# Patient Record
Sex: Male | Born: 1960 | Race: White | Hispanic: No | Marital: Single | Smoking: Former smoker
Health system: Southern US, Community
[De-identification: ages and names within clinical notes are randomized; demographics above are authoritative.]

## PROBLEM LIST (undated history)

## (undated) DIAGNOSIS — N200 Calculus of kidney: Secondary | ICD-10-CM

## (undated) DIAGNOSIS — R480 Dyslexia and alexia: Secondary | ICD-10-CM

## (undated) DIAGNOSIS — G4730 Sleep apnea, unspecified: Secondary | ICD-10-CM

## (undated) DIAGNOSIS — I10 Essential (primary) hypertension: Secondary | ICD-10-CM

## (undated) DIAGNOSIS — M1990 Unspecified osteoarthritis, unspecified site: Secondary | ICD-10-CM

## (undated) DIAGNOSIS — K429 Umbilical hernia without obstruction or gangrene: Secondary | ICD-10-CM

## (undated) DIAGNOSIS — R0602 Shortness of breath: Secondary | ICD-10-CM

## (undated) DIAGNOSIS — Z87442 Personal history of urinary calculi: Secondary | ICD-10-CM

## (undated) DIAGNOSIS — K219 Gastro-esophageal reflux disease without esophagitis: Secondary | ICD-10-CM

## (undated) HISTORY — PX: UMBILICAL HERNIA REPAIR: SHX196

## (undated) HISTORY — PX: TOTAL KNEE ARTHROPLASTY: SHX125

## (undated) HISTORY — PX: GASTRIC BYPASS: SHX52

## (undated) HISTORY — PX: ESOPHAGOGASTRODUODENOSCOPY (EGD) WITH ESOPHAGEAL DILATION: SHX5812

---

## 1998-06-13 ENCOUNTER — Encounter: Admission: RE | Admit: 1998-06-13 | Discharge: 1998-09-11 | Payer: Self-pay | Admitting: Anesthesiology

## 2000-05-06 ENCOUNTER — Encounter: Payer: Self-pay | Admitting: Neurosurgery

## 2000-05-06 ENCOUNTER — Ambulatory Visit (HOSPITAL_COMMUNITY): Admission: RE | Admit: 2000-05-06 | Discharge: 2000-05-06 | Payer: Self-pay | Admitting: Neurosurgery

## 2000-12-11 ENCOUNTER — Ambulatory Visit (HOSPITAL_COMMUNITY): Admission: RE | Admit: 2000-12-11 | Discharge: 2000-12-11 | Payer: Self-pay | Admitting: Neurosurgery

## 2000-12-11 ENCOUNTER — Encounter: Payer: Self-pay | Admitting: Neurosurgery

## 2002-12-12 HISTORY — PX: BACK SURGERY: SHX140

## 2002-12-16 ENCOUNTER — Ambulatory Visit (HOSPITAL_COMMUNITY): Admission: RE | Admit: 2002-12-16 | Discharge: 2002-12-16 | Payer: Self-pay | Admitting: Unknown Physician Specialty

## 2002-12-19 ENCOUNTER — Emergency Department (HOSPITAL_COMMUNITY): Admission: EM | Admit: 2002-12-19 | Discharge: 2002-12-19 | Payer: Self-pay | Admitting: Emergency Medicine

## 2003-07-12 ENCOUNTER — Ambulatory Visit (HOSPITAL_COMMUNITY): Admission: RE | Admit: 2003-07-12 | Discharge: 2003-07-12 | Payer: Self-pay | Admitting: Unknown Physician Specialty

## 2004-10-15 ENCOUNTER — Ambulatory Visit (HOSPITAL_COMMUNITY): Admission: RE | Admit: 2004-10-15 | Discharge: 2004-10-15 | Payer: Self-pay | Admitting: General Surgery

## 2004-10-31 ENCOUNTER — Observation Stay (HOSPITAL_COMMUNITY): Admission: RE | Admit: 2004-10-31 | Discharge: 2004-11-01 | Payer: Self-pay | Admitting: General Surgery

## 2006-01-23 ENCOUNTER — Observation Stay (HOSPITAL_COMMUNITY): Admission: RE | Admit: 2006-01-23 | Discharge: 2006-01-24 | Payer: Self-pay | Admitting: General Surgery

## 2007-08-20 HISTORY — PX: FOOT SURGERY: SHX648

## 2007-10-07 ENCOUNTER — Ambulatory Visit (HOSPITAL_COMMUNITY): Admission: RE | Admit: 2007-10-07 | Discharge: 2007-10-07 | Payer: Self-pay | Admitting: Family Medicine

## 2008-04-20 ENCOUNTER — Emergency Department (HOSPITAL_COMMUNITY): Admission: EM | Admit: 2008-04-20 | Discharge: 2008-04-20 | Payer: Self-pay | Admitting: Emergency Medicine

## 2008-12-11 ENCOUNTER — Ambulatory Visit: Admission: RE | Admit: 2008-12-11 | Discharge: 2008-12-11 | Payer: Self-pay | Admitting: Family Medicine

## 2009-02-13 ENCOUNTER — Ambulatory Visit (HOSPITAL_COMMUNITY): Admission: RE | Admit: 2009-02-13 | Discharge: 2009-02-13 | Payer: Self-pay | Admitting: Family Medicine

## 2009-09-22 ENCOUNTER — Inpatient Hospital Stay (HOSPITAL_COMMUNITY): Admission: RE | Admit: 2009-09-22 | Discharge: 2009-09-25 | Payer: Self-pay | Admitting: Orthopedic Surgery

## 2009-10-18 ENCOUNTER — Encounter: Admission: RE | Admit: 2009-10-18 | Discharge: 2010-01-17 | Payer: Self-pay | Admitting: Orthopedic Surgery

## 2009-12-10 ENCOUNTER — Emergency Department (HOSPITAL_COMMUNITY): Admission: EM | Admit: 2009-12-10 | Discharge: 2009-12-10 | Payer: Self-pay | Admitting: Emergency Medicine

## 2009-12-20 ENCOUNTER — Ambulatory Visit (HOSPITAL_BASED_OUTPATIENT_CLINIC_OR_DEPARTMENT_OTHER): Admission: RE | Admit: 2009-12-20 | Discharge: 2009-12-20 | Payer: Self-pay | Admitting: Orthopedic Surgery

## 2010-01-18 ENCOUNTER — Encounter
Admission: RE | Admit: 2010-01-18 | Discharge: 2010-04-18 | Payer: Self-pay | Source: Home / Self Care | Admitting: Orthopedic Surgery

## 2010-03-05 ENCOUNTER — Inpatient Hospital Stay (HOSPITAL_COMMUNITY): Admission: RE | Admit: 2010-03-05 | Discharge: 2010-03-08 | Payer: Self-pay | Admitting: Orthopedic Surgery

## 2010-03-09 ENCOUNTER — Emergency Department (HOSPITAL_COMMUNITY): Admission: EM | Admit: 2010-03-09 | Discharge: 2010-03-09 | Payer: Self-pay | Admitting: Emergency Medicine

## 2010-04-18 ENCOUNTER — Ambulatory Visit (HOSPITAL_COMMUNITY): Admission: RE | Admit: 2010-04-18 | Discharge: 2010-04-18 | Payer: Self-pay | Admitting: Orthopedic Surgery

## 2010-04-24 ENCOUNTER — Encounter: Admission: RE | Admit: 2010-04-24 | Discharge: 2010-05-17 | Payer: Self-pay | Admitting: Orthopedic Surgery

## 2010-11-02 LAB — URINALYSIS, ROUTINE W REFLEX MICROSCOPIC
Bilirubin Urine: NEGATIVE
Nitrite: NEGATIVE
Protein, ur: NEGATIVE mg/dL
Specific Gravity, Urine: 1.026 (ref 1.005–1.030)
Urobilinogen, UA: 0.2 mg/dL (ref 0.0–1.0)
pH: 5.5 (ref 5.0–8.0)

## 2010-11-02 LAB — URINE MICROSCOPIC-ADD ON

## 2010-11-02 LAB — PROTIME-INR: Prothrombin Time: 13.6 seconds (ref 11.6–15.2)

## 2010-11-02 LAB — CBC
HCT: 37.8 % — ABNORMAL LOW (ref 39.0–52.0)
Hemoglobin: 12.4 g/dL — ABNORMAL LOW (ref 13.0–17.0)
MCHC: 32.9 g/dL (ref 30.0–36.0)
Platelets: 273 10*3/uL (ref 150–400)
WBC: 9.4 10*3/uL (ref 4.0–10.5)

## 2010-11-02 LAB — COMPREHENSIVE METABOLIC PANEL
AST: 16 U/L (ref 0–37)
Calcium: 9.1 mg/dL (ref 8.4–10.5)
GFR calc non Af Amer: >60 mL/min (ref >60)
Potassium: 3.4 mEq/L — ABNORMAL LOW (ref 3.5–5.1)
Total Bilirubin: 0.2 mg/dL — ABNORMAL LOW (ref 0.3–1.2)
Total Protein: 7.3 g/dL (ref 6.0–8.3)

## 2010-11-03 LAB — POCT I-STAT, CHEM 8
BUN: 12 mg/dL (ref 6–23)
Creatinine, Ser: 0.8 mg/dL (ref 0.4–1.5)
Glucose, Bld: 97 mg/dL (ref 70–99)
HCT: 32 % — ABNORMAL LOW (ref 39.0–52.0)
Hemoglobin: 10.9 g/dL — ABNORMAL LOW (ref 13.0–17.0)

## 2010-11-03 LAB — PROTIME-INR
INR: 2.42 — ABNORMAL HIGH (ref 0.00–1.49)
INR: 2.03 — ABNORMAL HIGH (ref 0.00–1.49)
Prothrombin Time: 26.1 seconds — ABNORMAL HIGH (ref 11.6–15.2)
Prothrombin Time: 14 seconds (ref 11.6–15.2)
Prothrombin Time: 17.4 seconds — ABNORMAL HIGH (ref 11.6–15.2)
Prothrombin Time: 22.8 seconds — ABNORMAL HIGH (ref 11.6–15.2)

## 2010-11-03 LAB — BASIC METABOLIC PANEL
CO2: 31 mEq/L (ref 19–32)
Calcium: 8.3 mg/dL — ABNORMAL LOW (ref 8.4–10.5)
Chloride: 101 mEq/L (ref 96–112)
Creatinine, Ser: 0.98 mg/dL (ref 0.4–1.5)
GFR calc Af Amer: >60 mL/min (ref >60)
GFR calc non Af Amer: >60 mL/min (ref >60)
Glucose, Bld: 145 mg/dL — ABNORMAL HIGH (ref 70–99)

## 2010-11-03 LAB — CBC
HCT: 31.3 % — ABNORMAL LOW (ref 39.0–52.0)
MCH: 28 pg (ref 26.0–34.0)
MCHC: 32.9 g/dL (ref 30.0–36.0)
MCV: 83.8 fL (ref 78.0–100.0)
RBC: 3.72 MIL/uL — ABNORMAL LOW (ref 4.22–5.81)
RBC: 3.75 MIL/uL — ABNORMAL LOW (ref 4.22–5.81)
WBC: 13.1 10*3/uL — ABNORMAL HIGH (ref 4.0–10.5)
WBC: 13.9 10*3/uL — ABNORMAL HIGH (ref 4.0–10.5)

## 2010-11-03 LAB — POCT CARDIAC MARKERS
CKMB, poc: 1.5 ng/mL (ref 1.0–8.0)
Myoglobin, poc: 286 ng/mL (ref 12–200)

## 2010-11-04 LAB — URINALYSIS, ROUTINE W REFLEX MICROSCOPIC
Bilirubin Urine: NEGATIVE
Ketones, ur: NEGATIVE mg/dL
Protein, ur: NEGATIVE mg/dL
Urobilinogen, UA: 0.2 mg/dL (ref 0.0–1.0)

## 2010-11-04 LAB — CBC
HCT: 38.3 % — ABNORMAL LOW (ref 39.0–52.0)
Hemoglobin: 12.7 g/dL — ABNORMAL LOW (ref 13.0–17.0)
MCV: 82.2 fL (ref 78.0–100.0)
Platelets: 253 10*3/uL (ref 150–400)
RBC: 4.66 MIL/uL (ref 4.22–5.81)
WBC: 9.3 10*3/uL (ref 4.0–10.5)

## 2010-11-04 LAB — COMPREHENSIVE METABOLIC PANEL
Albumin: 3.4 g/dL — ABNORMAL LOW (ref 3.5–5.2)
Alkaline Phosphatase: 74 U/L (ref 39–117)
BUN: 10 mg/dL (ref 6–23)
Chloride: 105 mEq/L (ref 96–112)
Potassium: 3.6 mEq/L (ref 3.5–5.1)
Total Bilirubin: 0.6 mg/dL (ref 0.3–1.2)

## 2010-11-04 LAB — PROTIME-INR: INR: 1.03 (ref 0.00–1.49)

## 2010-11-04 LAB — SURGICAL PCR SCREEN: Staphylococcus aureus: NEGATIVE

## 2010-11-05 LAB — CBC
HCT: 41 % (ref 39.0–52.0)
Hemoglobin: 13.1 g/dL (ref 13.0–17.0)
MCHC: 32 g/dL (ref 30.0–36.0)
RDW: 14.1 % (ref 11.5–15.5)

## 2010-11-05 LAB — URINALYSIS, ROUTINE W REFLEX MICROSCOPIC
Leukocytes, UA: NEGATIVE
Protein, ur: NEGATIVE mg/dL
Urobilinogen, UA: 0.2 mg/dL (ref 0.0–1.0)

## 2010-11-05 LAB — COMPREHENSIVE METABOLIC PANEL
BUN: 10 mg/dL (ref 6–23)
Calcium: 8.7 mg/dL (ref 8.4–10.5)
Creatinine, Ser: 0.94 mg/dL (ref 0.4–1.5)
Glucose, Bld: 96 mg/dL (ref 70–99)
Sodium: 140 mEq/L (ref 135–145)
Total Protein: 7 g/dL (ref 6.0–8.3)

## 2010-11-05 LAB — URINE MICROSCOPIC-ADD ON

## 2010-11-05 LAB — PROTIME-INR
INR: 1.01 (ref 0.00–1.49)
Prothrombin Time: 13.2 seconds (ref 11.6–15.2)

## 2010-11-05 LAB — APTT: aPTT: 32 seconds (ref 24–37)

## 2010-11-08 LAB — CBC
HCT: 36.8 % — ABNORMAL LOW (ref 39.0–52.0)
Hemoglobin: 11.1 g/dL — ABNORMAL LOW (ref 13.0–17.0)
Hemoglobin: 12.4 g/dL — ABNORMAL LOW (ref 13.0–17.0)
MCHC: 33.2 g/dL (ref 30.0–36.0)
MCHC: 33.5 g/dL (ref 30.0–36.0)
MCV: 85.6 fL (ref 78.0–100.0)
Platelets: 213 10*3/uL (ref 150–400)
Platelets: 243 10*3/uL (ref 150–400)
Platelets: 271 10*3/uL (ref 150–400)
RDW: 14.1 % (ref 11.5–15.5)
RDW: 14.4 % (ref 11.5–15.5)
WBC: 17 10*3/uL — ABNORMAL HIGH (ref 4.0–10.5)

## 2010-11-08 LAB — PROTIME-INR
INR: 1.09 (ref 0.00–1.49)
INR: 1.35 (ref 0.00–1.49)
Prothrombin Time: 16.6 seconds — ABNORMAL HIGH (ref 11.6–15.2)
Prothrombin Time: 20.1 seconds — ABNORMAL HIGH (ref 11.6–15.2)

## 2010-11-08 LAB — ABO/RH: ABO/RH(D): A POS

## 2010-11-08 LAB — URINE MICROSCOPIC-ADD ON

## 2010-11-08 LAB — URINALYSIS, MICROSCOPIC ONLY
Bilirubin Urine: NEGATIVE
Ketones, ur: NEGATIVE mg/dL
Nitrite: NEGATIVE
Protein, ur: NEGATIVE mg/dL
Specific Gravity, Urine: 1.03 (ref 1.005–1.030)

## 2010-11-08 LAB — BASIC METABOLIC PANEL
BUN: 13 mg/dL (ref 6–23)
BUN: 16 mg/dL (ref 6–23)
CO2: 31 mEq/L (ref 19–32)
Calcium: 8.4 mg/dL (ref 8.4–10.5)
Calcium: 8.6 mg/dL (ref 8.4–10.5)
Chloride: 101 mEq/L (ref 96–112)
Creatinine, Ser: 1.04 mg/dL (ref 0.4–1.5)
GFR calc Af Amer: >60 mL/min (ref >60)
GFR calc non Af Amer: >60 mL/min (ref >60)
Glucose, Bld: 135 mg/dL — ABNORMAL HIGH (ref 70–99)
Potassium: 4.1 mEq/L (ref 3.5–5.1)
Sodium: 134 mEq/L — ABNORMAL LOW (ref 135–145)

## 2010-11-08 LAB — TYPE AND SCREEN: Antibody Screen: NEGATIVE

## 2010-11-08 LAB — URINALYSIS, ROUTINE W REFLEX MICROSCOPIC
Glucose, UA: NEGATIVE mg/dL
Ketones, ur: NEGATIVE mg/dL
Leukocytes, UA: NEGATIVE
Nitrite: NEGATIVE
Protein, ur: NEGATIVE mg/dL
Urobilinogen, UA: 0.2 mg/dL (ref 0.0–1.0)

## 2011-01-04 NOTE — Procedures (Signed)
   Tanner Meyers, Tanner Meyers                          ACCOUNT NO.:  [id number]   MEDICAL RECORD NO.:  [id number]                   PATIENT TYPE:  OUT   LOCATION:  RESP                                 FACILITY:  [facility]   PHYSICIAN:  Edward L. Juanetta Gosling, M.D.             DATE OF BIRTH:  11-Dec-1960   DATE OF PROCEDURE:  DATE OF DISCHARGE:                              PULMONARY FUNCTION TEST   IMPRESSION:  1. Spirometry shows no definite airflow obstruction, but the flow volume     move is somewhat rounded, which could indicate something like an extra     thoracic defect like tracheal stenosis, etc.  2. Lung volumes show mild restrictive change.  3. DLCO is moderately reduced.                                               Edward L. Juanetta Gosling, M.D.    ELH/MEDQ  D:  12/16/2002  T:  12/17/2002  Job:  [id number]   cc:   Colon Flattery, MD  [address]  [place]  [place] [address]  Fax: [phone]

## 2011-01-04 NOTE — Op Note (Signed)
NAMEJAKUB, DEBOLD NO.:  [id number]   MEDICAL RECORD NO.:  [id number]          PATIENT TYPE:  AMB   LOCATION:  DAY                           FACILITY:  [facility]   PHYSICIAN:  Barbaraann Barthel, M.D. DATE OF BIRTH:  March 07, 1961   DATE OF PROCEDURE:  10/31/2004  DATE OF DISCHARGE:                                 OPERATIVE REPORT   SURGEON:  Dr. Malvin Johns.   PREOPERATIVE DIAGNOSES:  1.  Cholecystitis secondary biliary dyskinesia.  2.  Umbilical hernia.   POSTOPERATIVE DIAGNOSES:  1.  Cholecystitis secondary biliary dyskinesia.  2.  Umbilical hernia.   PROCEDURE:  1.  Laparoscopic cholecystectomy.  2.  Umbilical herniorrhaphy.   SPECIMEN:  Gallbladder.   NOTE:  This is a 50 year old white male who had several weeks of right upper  quadrant pain and nausea and vomiting that was postprandial in nature. He  also presented with an umbilical hernia that he had had for approximately 20  years that had increased in size. We had discussed repair with this at the  same procedure in order to save him from being off from work and discussed  the complications of the procedure not limited to but including bleeding,  infection and regarding his gallbladder perforation of organs, damage to  bile ducts, and transitory diarrhea and regarding his umbilical hernia the  possibility of recurrence. Informed consent was obtained.   We should state that workup also included hepatobiliary scan which showed a  12% ejection fraction strongly suggestive of biliary dyskinesia. We also  discussed preoperatively that biliary dyskinesia is an entity whose  operative results are not as satisfying as in the case for gallstones.  Informed consent was obtained.   GROSS OPERATIVE FINDINGS:  The patient had a very fatty infiltrated  gallbladder, a small cystic duct which was not cannulated. No stones within  the gallbladder, and an umbilical hernia that had incarcerated omentum  within it.   TECHNIQUE:  The patient was placed in the supine position. After the inner  adequate administration of general anesthesia via endotracheal intubation,  his entire abdomen was prepped with Betadine solution and draped in the  usual manner. Prior to this, a Foley catheter was aseptically inserted. A  periumbilical incision was carried out just above the umbilical hernia, and  a Veress needle was inserted and confirmed in position with a saline drop  test. The abdomen was then insufflated approximately 3.6 liters of CO2. An  11-mm cannula was then placed in the umbilicus incision, and then under  direct vision, three other cannulas were placed. An 11-mm cannula just to  the left of the midline. There was a huge peripheral vein right in the area  where we usually placed the epigastric cannula and therefore we wanted to  avoid this. We then placed an 11-mm cannula in this site and then two 5-mm  cannulas in the right upper quadrant laterally. We then grasped the  gallbladder, took down its fatty adhesions, isolated the cystic duct which  was which was triply silver clipped. The patient had a lot of fatty omentum  which I likewise clipped that was adherent, and this was removed in order to  avoid any undue bleeding. The cystic artery was likewise cannulated and  triply silver clipped and divided. The gallbladder was  then removed  uneventfully from the hepatic bed with a cautery device and then removed  with an EndoCatch device through the epigastric incision. I elected to leave  Jackson-Pratt drain and Surgicel in the liver bed. Attention was then turned  to the umbilicus. This was dissected free, and then we did an open closure  using #1 Prolene to close the fascia in a transverse fashion after  dissecting the umbilicus skin from the defect, hernia sac and returning the  omentum that was dissected free back into the abdominal cavity. The fascia  was closed with #1 Prolene as state, interrupted  figure-of-eight fashion,  and we then used  #0 Prolene to close the incision where the 11-mm cannula  was placed in the umbilicus. We then irrigated and then closed the  subcutaneous layer, suturing the umbilicus to the fascia with 3-0 Polysorb  in order to return the umbilicus to its natural concave anatomy. We then  used 1/2% Sensorcaine on all incision sites to help with postoperative  comfort. We sutured the drain that exited from the lateral cannula site with  to the skin with 3-0 nylon suture and then put sterile OpSite dressings  around the epigastric incision and the medial 5-mm cannula sites and 4x4s  around the drain site and the umbilicus repair. Prior to closure, all  sponge, needle and instrument counts were found to be correct. Estimated  blood loss was minimal. The patient received 1,800 cc of crystalloids  intraoperatively. There were no complications.      WB/MEDQ  D:  10/31/2004  T:  10/31/2004  Job:  [id number]

## 2011-01-04 NOTE — Procedures (Signed)
NAMEHILBERT, Tanner Meyers                ACCOUNT NO.:  [id number]   MEDICAL RECORD NO.:  [id number]          PATIENT TYPE:  OUT   LOCATION:  SLEE                          FACILITY:  [facility]   PHYSICIAN:  Kofi A. Gerilyn Pilgrim, M.D. DATE OF BIRTH:  [birth year]   DATE OF PROCEDURE:  DATE OF DISCHARGE:  12/11/2008                             SLEEP DISORDER REPORT   REFERRING PHYSICIAN:  Delaney Meigs, MD   INDICATIONS:  This is a 51 year old man who presents with hypersomnia  snoring and is being evaluated for obstructive sleep apnea syndrome.   MEDICATIONS:  None.   EPWORTH SLEEPINESS SCALE:  Epworth sleepiness scale 16.  BMI 49.   ARCHITECTURAL SUMMARY:  This is a split night recording with the first  part being a diagnostic and the second part, a titration recording.  The  total recording time is 419 minutes.  Sleep efficiency is 68%.  Sleep  latency is 21 minutes.  The REM latency is 128 minutes.   RESPIRATORY SUMMARY:  Baseline oxygen saturation is 97%.  Lowest  saturation is 71% during the REM sleep.  The diagnostic AHI is 52 with  more frequent and more severe events occurring during the REM sleep.  The patient was titrated between pressures of 5 and 11.  The optimal  pressure is 11 with resolution of obstructive events.   LIMB MOVEMENT SUMMARY:  PLM index is 21.   ELECTROCARDIOGRAM SUMMARY:  Average heart rate is 62 with no significant  dysrhythmias observed.   IMPRESSION:  Severe obstructive sleep apnea syndrome worse during the  rapid eye movement sleep, which responds well to a continuous positive  airway pressure of 11.   Thanks for this referral.      Kofi A. Gerilyn Pilgrim, M.D.  Electronically Signed     KAD/MEDQ  D:  12/16/2008  T:  12/16/2008  Job:  [id number]

## 2011-01-04 NOTE — Op Note (Signed)
[place]  Patient:    Tanner Meyers, Tanner Meyers                       MRN: [id number] Proc. Date: 12/11/00 Adm. 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  OPERATION PERFORMED:  Right L4-5 interlaminar laminotomy for excision of herniated disk with operating microscope.  SURGEON:  Reinaldo Meeker, M.D.  ASSISTANT:  Donalee Citrin, Montez Hageman.  ANESTHESIA:  DESCRIPTION OF PROCEDURE:  After being placed in the prone position, the patients back was prepped and draped in the usual sterile fashion. Localizing x-ray was taken prior to incision to identify the appropriate level.  A midline incision was made about the spinous processes of L5 and L5. Using Bovie cutting current, the incision was carried down to the spinous processes.  Subperiosteal dissection was then carried out on the right-sided spinous processes and lamina and McCullough self-retaining retractor was placed for exposure.  A second x-ray was taken to confirm approach to the L4-5 level and this was correct.  Using the high speed drill, the inferior one third of the L4 lamina and the medial one third of the facet joint were removed.  The drill was then used to remove the superior one third of the L5 lamina.  Residual bone and ligamentum flavum were removed in a piecemeal fashion.  The microscope was draped and brought into the field and used for the remainder of the case.  Using microdissection technique, the lateral aspect of the thecal sac and L5 nerve root were identified.  Bovie coagulation was carried down to the floor of the canal to identify the L4-5 disk was found to be markedly herniated beneath the nerve root and centrally.  After coagulating the annulus, the annulus was incised with a 15 blade.   Using pituitary rongeurs and curets, a very thorough disk space clean-out was carried out while at the same time  great care was taken to avoid injury to the neural elements.  This was successfully done.  Calcified annulus at the superior edge of the disk space was then pushed down in the disk space and removed, allowing additional disk space clean out.  At this point inspection was carried out in all directions for any evidence of residual compression and none could be identified.  Particular attention was paid to the midline where no further disk herniation could be felt.  At this point large amounts of irrigation were carried out and any bleeding controlled with bipolar coagulation and Gelfoam.  The wound was then closed using interrupted Vicryl in the muscle, fascia, subcutaneous tissues, subcuticular tissues and staples on the skin.  Sterile dressing was then applied.  Patient was extubated and taken to the recovery room in stable condition. DD:  12/11/00 TD:  12/11/00 Job: [id number]

## 2011-01-04 NOTE — Op Note (Signed)
NAMEDINNIS, ROG NO.:  [id number]   MEDICAL RECORD NO.:  [id number]          PATIENT TYPE:  AMB   LOCATION:  DAY                           FACILITY:  [facility]   PHYSICIAN:  Barbaraann Barthel, M.D. DATE OF BIRTH:  21-Sep-1960   DATE OF PROCEDURE:  01/23/2006  DATE OF DISCHARGE:                                 OPERATIVE REPORT   SURGEON:  Dr. Malvin Johns.   PREOPERATIVE DIAGNOSIS:  Recurrent umbilical hernia.   POSTOPERATIVE DIAGNOSIS:  Recurrent umbilical hernia.   PROCEDURE:  Repair of her recurrent umbilical hernia with MTF allograft  graft.   NOTE:  This is a 50 year old obese white male who I repaired an umbilical  hernia on earlier, and during a laparoscopic cholecystectomy procedure, this  recurred.  This person was quite large,  weighing 295 pounds and he  presented with recurrence.  We discussed the need for placing a mesh in this  patient, discussing complications not limited to but including bleeding,  infection and recurrence.  Informed consent was obtained.   GROSS OPERATIVE FINDINGS:  The patient had a hernia defect slightly larger  than a silver dollar.  No infarcted bowel or any infarcted omentum or other  problems.   TECHNIQUE:  The patient was placed in supine position.  After adequate  administration of general anesthesia via endotracheal intubation, his entire  abdomen was prepped with Betadine solution and draped in the usual manner.  A supraumbilical incision was carried out over the area of the previous scar  through skin, subcutaneous tissue.  We then dissected down to the fascia,  and then having good circumferential fascia around this, we then placed a  MTF graft inside the abdomen.  This graft had been soaked in saline and  Neosporin solution.  We then tacked this circumferentially with 2-0 Prolene  sutures without any great tension.  This was sutured circumferentially.  We  then tacked the umbilicus to a piece of fascia tissue in order  to return the  umbilicus to its normal concave appearance.  We then closed the subcutaneous  tissue with 3-0 Polysorb and closed the skin with a stapling device.  A  sterile dressing with Neosporin was applied.  Prior to closure, all sponge,  needle and instrument counts were found be correct.  Estimated blood loss  was minimal.  The patient received 2100 mL crystalloids intraoperatively.  No drains were placed.  There were no complications.      Barbaraann Barthel, M.D.  Electronically Signed     WB/MEDQ  D:  01/23/2006  T:  01/23/2006  Job:  [id number]

## 2011-02-04 ENCOUNTER — Other Ambulatory Visit (HOSPITAL_COMMUNITY): Payer: Self-pay

## 2011-02-04 ENCOUNTER — Other Ambulatory Visit: Payer: Self-pay | Admitting: Orthopedic Surgery

## 2011-02-04 ENCOUNTER — Other Ambulatory Visit (HOSPITAL_COMMUNITY): Payer: Self-pay | Admitting: Orthopedic Surgery

## 2011-02-04 ENCOUNTER — Ambulatory Visit (HOSPITAL_COMMUNITY)
Admission: RE | Admit: 2011-02-04 | Discharge: 2011-02-04 | Disposition: A | Discharge status: Home / Self Care | Payer: Self-pay | Source: Ambulatory Visit | Attending: Orthopedic Surgery | Admitting: Orthopedic Surgery

## 2011-02-04 ENCOUNTER — Encounter (HOSPITAL_COMMUNITY): Payer: Self-pay

## 2011-02-04 DIAGNOSIS — M129 Arthropathy, unspecified: Secondary | ICD-10-CM

## 2011-02-04 DIAGNOSIS — M479 Spondylosis, unspecified: Secondary | ICD-10-CM | POA: Insufficient documentation

## 2011-02-04 DIAGNOSIS — Z01810 Encounter for preprocedural cardiovascular examination: Secondary | ICD-10-CM | POA: Insufficient documentation

## 2011-02-04 DIAGNOSIS — M24669 Ankylosis, unspecified knee: Secondary | ICD-10-CM | POA: Insufficient documentation

## 2011-02-04 DIAGNOSIS — G4730 Sleep apnea, unspecified: Secondary | ICD-10-CM | POA: Insufficient documentation

## 2011-02-04 DIAGNOSIS — M2469 Ankylosis, other specified joint: Secondary | ICD-10-CM | POA: Insufficient documentation

## 2011-02-04 DIAGNOSIS — Z01812 Encounter for preprocedural laboratory examination: Secondary | ICD-10-CM | POA: Insufficient documentation

## 2011-02-04 DIAGNOSIS — Z01818 Encounter for other preprocedural examination: Secondary | ICD-10-CM | POA: Insufficient documentation

## 2011-02-04 LAB — URINALYSIS, ROUTINE W REFLEX MICROSCOPIC
Glucose, UA: NEGATIVE mg/dL
Leukocytes, UA: NEGATIVE
Protein, ur: NEGATIVE mg/dL
pH: 6 (ref 5.0–8.0)

## 2011-02-04 LAB — PROTIME-INR
INR: 1 (ref 0.00–1.49)
Prothrombin Time: 13.4 seconds (ref 11.6–15.2)

## 2011-02-04 LAB — COMPREHENSIVE METABOLIC PANEL
Albumin: 3.6 g/dL (ref 3.5–5.2)
BUN: 16 mg/dL (ref 6–23)
Calcium: 9.5 mg/dL (ref 8.4–10.5)
Creatinine, Ser: 0.87 mg/dL (ref 0.50–1.35)
Total Bilirubin: 0.2 mg/dL — ABNORMAL LOW (ref 0.3–1.2)
Total Protein: 7.2 g/dL (ref 6.0–8.3)

## 2011-02-04 LAB — URINE MICROSCOPIC-ADD ON

## 2011-02-04 LAB — CBC
MCHC: 31 g/dL (ref 30.0–36.0)
RDW: 14.4 % (ref 11.5–15.5)

## 2011-02-04 LAB — SURGICAL PCR SCREEN: Staphylococcus aureus: NEGATIVE

## 2011-02-11 ENCOUNTER — Ambulatory Visit (HOSPITAL_COMMUNITY)
Admission: RE | Admit: 2011-02-11 | Discharge: 2011-02-11 | Disposition: A | Discharge status: Home / Self Care | Payer: Self-pay | Source: Ambulatory Visit | Attending: Orthopedic Surgery | Admitting: Orthopedic Surgery

## 2011-02-11 DIAGNOSIS — Z96659 Presence of unspecified artificial knee joint: Secondary | ICD-10-CM | POA: Insufficient documentation

## 2011-02-11 DIAGNOSIS — I498 Other specified cardiac arrhythmias: Secondary | ICD-10-CM | POA: Insufficient documentation

## 2011-02-11 DIAGNOSIS — M24669 Ankylosis, unspecified knee: Secondary | ICD-10-CM | POA: Insufficient documentation

## 2011-02-11 DIAGNOSIS — Z01818 Encounter for other preprocedural examination: Secondary | ICD-10-CM | POA: Insufficient documentation

## 2011-02-11 DIAGNOSIS — M2469 Ankylosis, other specified joint: Secondary | ICD-10-CM | POA: Insufficient documentation

## 2011-02-11 DIAGNOSIS — G4733 Obstructive sleep apnea (adult) (pediatric): Secondary | ICD-10-CM | POA: Insufficient documentation

## 2011-02-11 DIAGNOSIS — Z01812 Encounter for preprocedural laboratory examination: Secondary | ICD-10-CM | POA: Insufficient documentation

## 2011-02-11 DIAGNOSIS — Z01810 Encounter for preprocedural cardiovascular examination: Secondary | ICD-10-CM | POA: Insufficient documentation

## 2011-02-13 NOTE — Op Note (Signed)
  NAMEHILMAN, Tanner Meyers NO.:  [id number]  MEDICAL RECORD NO.:  [id number]  LOCATION:  DAYL                         FACILITY:  [facility]  PHYSICIAN:  Ollen Gross, M.D.    DATE OF BIRTH:  August 04, 1961  DATE OF PROCEDURE:  02/11/2011 DATE OF DISCHARGE:  02/11/2011                              OPERATIVE REPORT   PREOPERATIVE DIAGNOSIS:  Arthrofibrosis, left knee.  POSTOPERATIVE DIAGNOSIS:  Arthrofibrosis, left knee.  PROCEDURE:  Left knee closed manipulation.  SURGEON:  Ollen Gross, MD  ASSISTANT:  None.  ANESTHESIA:  General.  Pre-manipulation range of motion 5 to 90, post-manipulation range of motion 5 to 120.  COMPLICATIONS:  None.  CONDITION:  Stable to Recovery.  BRIEF CLINICAL NOTE:  Tanner Meyers is a 50 year old male who had a left total knee done approximately a year ago.  He has had problems with stiffness. He was seen in the office 2 weeks ago and flexion was only 90 degrees. We discussed the options and he opted for closed manipulation.  He presents today for that procedure.  PROCEDURE IN DETAIL:  After successful administration of general anesthetic, an exam under anesthesia was performed showing range 5 to 90 degrees.  I then placed my chest against his proximal tibia, flexed the knee with audible lysis of adhesions.  I got to 120 degrees easily at the point where his calf was hitting the posterior thigh.  He got within 3-4 degrees of full extension also.  We manipulated patella to help mobilize that better.  He was subsequently awakened and transported to Recovery in stable condition.     Ollen Gross, M.D.     FA/MEDQ  D:  02/11/2011  T:  02/11/2011  Job:  [id number]  Electronically Signed by Ollen Gross M.D. on 02/13/2011 10:09:01 AM

## 2011-05-22 LAB — URINE MICROSCOPIC-ADD ON

## 2011-05-22 LAB — URINALYSIS, ROUTINE W REFLEX MICROSCOPIC
Bilirubin Urine: NEGATIVE
Leukocytes, UA: NEGATIVE
Nitrite: NEGATIVE
Specific Gravity, Urine: >1.03 — ABNORMAL HIGH
pH: 5

## 2013-02-08 ENCOUNTER — Telehealth: Payer: Self-pay

## 2013-02-08 ENCOUNTER — Other Ambulatory Visit: Payer: Self-pay

## 2013-02-08 DIAGNOSIS — Z1211 Encounter for screening for malignant neoplasm of colon: Secondary | ICD-10-CM

## 2013-02-10 NOTE — Telephone Encounter (Signed)
Gastroenterology Pre-Procedure Form    Request Date: 02/09/2013     Requesting Physician: Dr. Regino Schultze     PATIENT INFORMATION:  Tanner Meyers is a 53 y.o., male (DOB=May 27, 1961).  PROCEDURE: Procedure(s) requested: colonoscopy Procedure Reason: screening for colon cancer  PATIENT REVIEW QUESTIONS: The patient reports the following:   1. Diabetes Melitis: no 2. Joint replacements in the past 12 months: no 3. Major health problems in the past 3 months: no 4. Has an artificial valve or MVP:no 5. Has been advised in past to take antibiotics in advance of a procedure like teeth cleaning: no}    MEDICATIONS & ALLERGIES:    Patient reports the following regarding taking any blood thinners:   Plavix? no Aspirin?no Coumadin?  no  Patient confirms/reports the following medications:  Current Outpatient Prescriptions  Medication Sig Dispense Refill  . doxycycline (VIBRAMYCIN) 100 MG capsule Take 100 mg by mouth 2 (two) times daily. Pt is finishing a 10 day supply for blisters under his arm      . losartan (COZAAR) 50 MG tablet Take 50 mg by mouth daily.       No current facility-administered medications for this visit.    Patient confirms/reports the following allergies:  No Known Allergies  Patient is appropriate to schedule for requested procedure(s): yes  AUTHORIZATION INFORMATION Primary Insurance:   ID #:  Group #:  Pre-Cert / Auth required:  Pre-Cert / Auth #:   Secondary Insurance:   ID #:   Group #:  Pre-Cert / Auth required Pre-Cert / Auth #:   No orders of the defined types were placed in this encounter.    SCHEDULE INFORMATION: Procedure has been scheduled as follows:  Date: 03/01/2013     Time: 10:30 AM  Location: [facility] Short Stay  This Gastroenterology Pre-Precedure Form is being routed to the following provider(s) for review: R. Roetta Sessions, MD

## 2013-02-11 NOTE — Telephone Encounter (Signed)
Appropriate.

## 2013-02-12 MED ORDER — PEG-KCL-NACL-NASULF-NA ASC-C 100 G PO SOLR: 1.0000 | ORAL | Status: DC

## 2013-02-12 NOTE — Telephone Encounter (Signed)
Rx sent to pharmacy and instructions mailed to pt.  

## 2013-02-15 ENCOUNTER — Telehealth: Payer: Self-pay

## 2013-02-15 NOTE — Telephone Encounter (Signed)
Pt called and said he has a hernia at his umbilicus. He has had surgery before and has a mesh, but it came back. He wants to know is it save to have the colonoscopy and have the air inserted. He is limited to his lifting ability. Please advise!  ( Call back is [phone]).

## 2013-02-16 NOTE — Telephone Encounter (Signed)
Called and informed pt.  

## 2013-02-16 NOTE — Telephone Encounter (Signed)
Called pt. He said that he is having some bulging at his belly button. It has been several years ( he was not sure when) that Dr. Malvin Johns did surgery on him. He has been going to the [facility] at [place] and he had one doctor tell him that it might be a couple of hernias in that area.

## 2013-02-16 NOTE — Telephone Encounter (Signed)
It should be ok. We do procedures on patients with known hernias routinely.

## 2013-02-16 NOTE — Telephone Encounter (Signed)
How large is this hernia? Shouldn't be an issue.

## 2013-02-25 ENCOUNTER — Encounter (HOSPITAL_COMMUNITY): Payer: Self-pay | Admitting: Pharmacy Technician

## 2013-03-01 ENCOUNTER — Encounter (HOSPITAL_COMMUNITY)
Admission: RE | Disposition: A | Discharge status: Home / Self Care | Payer: Self-pay | Source: Ambulatory Visit | Attending: Internal Medicine

## 2013-03-01 ENCOUNTER — Encounter (HOSPITAL_COMMUNITY): Payer: Self-pay | Admitting: *Deleted

## 2013-03-01 ENCOUNTER — Ambulatory Visit (HOSPITAL_COMMUNITY)
Admission: RE | Admit: 2013-03-01 | Discharge: 2013-03-01 | Disposition: A | Discharge status: Home / Self Care | Payer: BC Managed Care – PPO | Source: Ambulatory Visit | Attending: Internal Medicine | Admitting: Internal Medicine

## 2013-03-01 DIAGNOSIS — D126 Benign neoplasm of colon, unspecified: Secondary | ICD-10-CM

## 2013-03-01 DIAGNOSIS — Z1211 Encounter for screening for malignant neoplasm of colon: Secondary | ICD-10-CM

## 2013-03-01 DIAGNOSIS — I10 Essential (primary) hypertension: Secondary | ICD-10-CM | POA: Insufficient documentation

## 2013-03-01 DIAGNOSIS — D129 Benign neoplasm of anus and anal canal: Secondary | ICD-10-CM | POA: Insufficient documentation

## 2013-03-01 DIAGNOSIS — K5730 Diverticulosis of large intestine without perforation or abscess without bleeding: Secondary | ICD-10-CM | POA: Insufficient documentation

## 2013-03-01 DIAGNOSIS — K620 Anal polyp: Secondary | ICD-10-CM

## 2013-03-01 DIAGNOSIS — K621 Rectal polyp: Secondary | ICD-10-CM

## 2013-03-01 DIAGNOSIS — D128 Benign neoplasm of rectum: Secondary | ICD-10-CM | POA: Insufficient documentation

## 2013-03-01 HISTORY — DX: Umbilical hernia without obstruction or gangrene: K42.9

## 2013-03-01 HISTORY — DX: Personal history of urinary calculi: Z87.442

## 2013-03-01 HISTORY — DX: Sleep apnea, unspecified: G47.30

## 2013-03-01 HISTORY — DX: Gastro-esophageal reflux disease without esophagitis: K21.9

## 2013-03-01 HISTORY — DX: Unspecified osteoarthritis, unspecified site: M19.90

## 2013-03-01 HISTORY — PX: COLONOSCOPY: SHX5424

## 2013-03-01 HISTORY — DX: Essential (primary) hypertension: I10

## 2013-03-01 HISTORY — DX: Shortness of breath: R06.02

## 2013-03-01 SURGERY — COLONOSCOPY
Anesthesia: Moderate Sedation

## 2013-03-01 MED ORDER — STERILE WATER FOR IRRIGATION IR SOLN
Status: DC | PRN
  Administered 2013-03-01: 11:00:00

## 2013-03-01 MED ORDER — MIDAZOLAM HCL 5 MG/5ML IJ SOLN
INTRAMUSCULAR | Status: DC | PRN
  Administered 2013-03-01 (×2): 2 mg via INTRAVENOUS
  Administered 2013-03-01: 1 mg via INTRAVENOUS

## 2013-03-01 MED ORDER — ONDANSETRON HCL 4 MG/2ML IJ SOLN
INTRAMUSCULAR | Status: DC | PRN
  Administered 2013-03-01: 4 mg via INTRAVENOUS

## 2013-03-01 MED ORDER — SODIUM CHLORIDE 0.9 % IV SOLN
INTRAVENOUS | Status: DC
  Administered 2013-03-01: 11:00:00 via INTRAVENOUS

## 2013-03-01 MED ORDER — ONDANSETRON HCL 4 MG/2ML IJ SOLN
INTRAMUSCULAR | Status: AC
  Filled 2013-03-01: qty 2

## 2013-03-01 MED ORDER — MIDAZOLAM HCL 5 MG/5ML IJ SOLN
INTRAMUSCULAR | Status: AC
  Filled 2013-03-01: qty 10

## 2013-03-01 MED ORDER — MEPERIDINE HCL 100 MG/ML IJ SOLN
INTRAMUSCULAR | Status: DC | PRN
  Administered 2013-03-01: 50 mg via INTRAVENOUS
  Administered 2013-03-01: 25 mg via INTRAVENOUS

## 2013-03-01 MED ORDER — MEPERIDINE HCL 100 MG/ML IJ SOLN
INTRAMUSCULAR | Status: AC
  Filled 2013-03-01: qty 2

## 2013-03-01 NOTE — Op Note (Signed)
[facility] [address] [place], [address]   COLONOSCOPY PROCEDURE REPORT  PATIENT: [name], [name]  MR#:         [id number] BIRTHDATE: Oct 28, 1960 , 52  yrs. old GENDER: Male ENDOSCOPIST: R.  Roetta Sessions, MD FACP FACG REFERRED BY:  Karleen Hampshire, M.D. PROCEDURE DATE:  03/01/2013 PROCEDURE:     Colonoscopy with biopsy and snare polypectomy  INDICATIONS: First ever average risk colorectal cancer screening examination.  INFORMED CONSENT:  The risks, benefits, alternatives and imponderables including but not limited to bleeding, perforation as well as the possibility of a missed lesion have been reviewed.  The potential for biopsy, lesion removal, etc. have also been discussed.  Questions have been answered.  All parties agreeable. Please see the history and physical in the medical record for more information.  MEDICATIONS: Versed 5 mg IV and Demerol 75 mg IV in divided doses. Zofran 4 mg IV  DESCRIPTION OF PROCEDURE:  After a digital rectal exam was performed, the EC-3890Li ([id number])  colonoscope was advanced from the anus through the rectum and colon to the area of the cecum, ileocecal valve and appendiceal orifice.  The cecum was deeply intubated.  These structures were well-seen and photographed for the record.  From the level of the cecum and ileocecal valve, the scope was slowly and cautiously withdrawn.  The mucosal surfaces were carefully surveyed utilizing scope tip deflection to facilitate fold flattening as needed.  The scope was pulled down into the rectum where a thorough examination including retroflexion was performed.    FINDINGS:  Adequate preparation. (1) diminutive polyp within 2 cm of the anal verge; otherwise, the remainder of the rectal mucosa appeared normal. Scattered sigmoid diverticula; (1) 5 mm sessile polyp in the cecum; otherwise, the remainder of the colonic mucosa appeared normal.  THERAPEUTIC / DIAGNOSTIC MANEUVERS  PERFORMED:  The above-mentioned polyp cold biopsied and hot snare removed, respectively.  COMPLICATIONS: None  CECAL WITHDRAWAL TIME:  9 minutes  IMPRESSION:  Colonic and rectal polyps-removed as described above. Colonic diverticulosis  RECOMMENDATIONS: Followup on pathology.   _______________________________ eSigned:  R. Roetta Sessions, MD FACP [facility] 03/01/2013 11:53 AM   CC:    PATIENT NAME:  Tanner Meyers, Tanner Meyers MR#: [id number]

## 2013-03-01 NOTE — H&P (Signed)
Primary Care Physician:  Pershing Proud Primary Gastroenterologist:  Dr. Jena Gauss  Pre-Procedure History & Physical: HPI:  Tanner Meyers is a 52 y.o. male is here for a screening colonoscopy.     Past Medical History  Diagnosis Date  . Hypertension   . Umbilical hernia   . Shortness of breath     SOB with no exertion  . History of kidney stones   . GERD (gastroesophageal reflux disease)   . Arthritis   . Sleep apnea     supposed to wear BiPaP machine at night, but doesn't    Past Surgical History  Procedure Laterality Date  . 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 Back surgery  12/12/2002    [facility]  . Esophagogastroduodenoscopy (egd) with esophageal dilation      [facility]  . Foot surgery Right 2009    [facility]  . Umbilical hernia repair      [facility]    Prior to Admission medications   Medication Sig Start Date End Date Taking? Authorizing Provider  losartan (COZAAR) 50 MG tablet Take 50 mg by mouth daily.   Yes Historical Provider, MD  peg 3350 powder (MOVIPREP) 100 G SOLR Take 1 kit (100 g total) by mouth as directed. 02/12/13  Yes Corbin Ade, MD    Allergies as of 02/08/2013  . (Not on File)    History reviewed. No pertinent family history.  History   Social History  . Marital Status: Single    Spouse Name: N/A    Number of Children: N/A  . Years of Education: N/A   Occupational History  . Not on file.   Social History Main Topics  . Smoking status: Former Smoker -- 17 years    Types: Cigarettes  . Smokeless tobacco: Not on file  . Alcohol Use: No  . Drug Use: No  . Sexually Active: Not on file   Other Topics Concern  . Not on file   Social History Narrative  . No narrative on file    Review of Systems: See HPI, otherwise negative ROS  Physical Exam: BP 134/86  Temp(Src) 98.2 F (36.8 C) (Oral)  Resp 17  Ht 5\' 8"  (1.727 m)  Wt 321 lb (145.605 kg)   BMI 48.82 kg/m2  SpO2 92% General:   Alert,  Well-developed, well-nourished, morbidly obese, pleasant and cooperative in NAD Head:  Normocephalic and atraumatic. Eyes:  Sclera clear, no icterus.   Conjunctiva pink. Ears:  Normal auditory acuity. Nose:  No deformity, discharge,  or lesions. Mouth:  No deformity or lesions, dentition normal. Neck:  Supple; no masses or thyromegaly. Lungs:  Clear throughout to auscultation.   No wheezes, crackles, or rhonchi. No acute distress. Heart:  Regular rate and rhythm; no murmurs, clicks, rubs,  or gallops. Abdomen:  Morbidly obese. Normal bowel sounds, soft and nontender. No mass or organomegaly. Easily reducible small umbilical hernia.    Msk:  Symmetrical without gross deformities. Normal posture. Pulses:  Normal pulses noted. Extremities:  Without clubbing or edema. Neurologic:  Alert and  oriented x4;  grossly normal neurologically. Skin:  Intact without significant lesions or rashes. Cervical Nodes:  No significant cervical adenopathy. Psych:  Alert and cooperative. Normal mood and affect.  Impression/Plan: Tanner Meyers is now here to undergo a screening colonoscopy.  Average risk screening examination.  Risks, benefits, limitations, imponderables and alternatives regarding colonoscopy have been reviewed with  the patient. Questions have been answered. All parties agreeable.

## 2013-03-03 ENCOUNTER — Encounter: Payer: Self-pay | Admitting: Internal Medicine

## 2013-03-03 ENCOUNTER — Encounter (HOSPITAL_COMMUNITY): Payer: Self-pay | Admitting: Internal Medicine

## 2013-04-27 ENCOUNTER — Other Ambulatory Visit (HOSPITAL_COMMUNITY): Payer: Self-pay | Admitting: Family Medicine

## 2013-04-27 ENCOUNTER — Ambulatory Visit (HOSPITAL_COMMUNITY)
Admission: RE | Admit: 2013-04-27 | Discharge: 2013-04-27 | Disposition: A | Discharge status: Home / Self Care | Payer: BC Managed Care – PPO | Source: Ambulatory Visit | Attending: Family Medicine | Admitting: Family Medicine

## 2013-04-27 DIAGNOSIS — M25559 Pain in unspecified hip: Secondary | ICD-10-CM | POA: Insufficient documentation

## 2013-04-27 DIAGNOSIS — M25551 Pain in right hip: Secondary | ICD-10-CM

## 2013-04-30 ENCOUNTER — Telehealth (HOSPITAL_COMMUNITY): Payer: Self-pay | Admitting: Dietician

## 2013-04-30 NOTE — Telephone Encounter (Signed)
Received referral via fax from [facility] for dx: obesity.  

## 2013-04-30 NOTE — Telephone Encounter (Signed)
Called at 1435. Appointment scheduled for 05/06/13 at 0900.

## 2013-05-06 ENCOUNTER — Encounter (HOSPITAL_COMMUNITY): Payer: Self-pay | Admitting: Dietician

## 2013-05-06 NOTE — Progress Notes (Signed)
Outpatient Initial Nutrition Assessment  Date:05/06/2013   Appt Start Time: 0850  Referring Physician: [facility] Terie Purser, PA-C) Reason for Visit: obesity  Nutrition Assessment:  Height: 5\' 8"  (172.7 cm)   Weight: 324 lb (146.965 kg)   IBW: 154# %IBW: 210% UBW: 344# %UBW: 90% Body mass index is 49.28 kg/(m^2).  Meets criteria for extreme obesity, class III. Goal Weight: 292# (10% loss of current weight) Weight hx: Pt reports highest weight of 344# 4-5 years ago. His lowest weight was 316# about 6 months ago. He reports He maintained UBW of 210# until he was 52 years old, then progressive started gaining weight. JNoted 8# (2.5%) gain x 6 months.  Estimated nutritional needs:  Kcals/ day: 2300-2400 Protein (grams)/day: 118-147 Fluid (L)/ day: 2.3-2.4  PMH:  Past Medical History  Diagnosis Date  . Hypertension   . Umbilical hernia   . Shortness of breath     SOB with no exertion  . History of kidney stones   . GERD (gastroesophageal reflux disease)   . Arthritis   . Sleep apnea     supposed to wear BiPaP machine at night, but doesn't    Medications:  Current Outpatient Rx  Name  Route  Sig  Dispense  Refill  . albuterol (PROVENTIL HFA;VENTOLIN HFA) 108 (90 BASE) MCG/ACT inhaler   Inhalation   Inhale 2 puffs into the lungs every 6 (six) hours as needed for wheezing.         Marland Kitchen losartan (COZAAR) 50 MG tablet   Oral   Take 50 mg by mouth daily.         . traZODone (DESYREL) 50 MG tablet   Oral   Take 50 mg by mouth at bedtime.         Marland Kitchen zolpidem (AMBIEN) 10 MG tablet   Oral   Take 10 mg by mouth at bedtime as needed for sleep.         . peg 3350 powder (MOVIPREP) 100 G SOLR   Oral   Take 1 kit (100 g total) by mouth as directed.   1 kit   0     Labs: CMP     Component Value Date/Time   NA 139 02/04/2011 1310   K 4.2 02/04/2011 1310   CL 103 02/04/2011 1310   CO2 29 02/04/2011 1310   GLUCOSE 81 02/04/2011 1310   BUN 16 02/04/2011 1310   CREATININE 0.87 02/04/2011 1310   CALCIUM 9.5 02/04/2011 1310   PROT 7.2 02/04/2011 1310   ALBUMIN 3.6 02/04/2011 1310   AST 15 02/04/2011 1310   ALT 24 02/04/2011 1310   ALKPHOS 88 02/04/2011 1310   BILITOT 0.2* 02/04/2011 1310   GFRNONAA >60 02/04/2011 1310   GFRAA >60 02/04/2011 1310    Lipid Panel  No results found for this basename: chol, trig, hdl, cholhdl, vldl, ldlcalc     No results found for this basename: HGBA1C   Lab Results  Component Value Date   CREATININE 0.87 02/04/2011     Lifestyle/ social habits: Mr. Penton lives in [place] with his girlfriend. He reports that his stress level is very low. He is sedentary, due to knee and hip pain. 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 It does not appear that he has tried a regular physical  activity regimen outside of his work routine in the past.   Nutrition hx/habits: Mr. Kucinski desires weight loss. He reports he has "tried everything except quitting eating". In the past, he has tried exercise and the Atkins diet, both of which he reports he had very minimal success. He reports he was referred to a physician-assisted weight loss clinic in [facility], but discontinued after the first session "because all they talked about is losing weight with this juice diet". He reports he started losing weight about 3 years ago, when he was at his highest weight of 344#, when he was attending a free clinic. He was able to get down to 316, but is concerned that he is gaining weight back.  Current diet is limited; pt reports he will not eat any vegetables or many fruits other than grapes, apples, and strawberries. He reports cutting back on fast food and bread for approximately one year. He has eliminated sodas and mainly drinks water, black coffee, and a 16 oz sweet tea daily. Upon further  questioning, pt admits to snacking daily, as often as every hours, on nabs, saltines, or ritz crackers. He feels like he is "starving" himself and reports frustration that he cannot eat the foods that likes (potatoes, bread, pasta).  He tell this RD that he maintained a wt of 210# up until 20 years ago- he admits to a high fat diet that included 2 sausage mcmuffins and fried foods. Once he started going a healthier routine (decreasing carbs and eating smaller portions) he started to gain weight. He reveals that he is tired of being obese and he would much rather die than live this way. He became very tearful, angry, and emotional at this visit. He has thought about bariatric surgery and became extremely upset that this appointment was not an evaluation for bariatric surgery.  He reported multiple times that he is uninterested in changing his way of eating and is frustrated of his inability to be as active as he would like.   Diet recall: Breakfast: 2 eggs, piece of toast, black coffee; Lunch: (skip 2 x per week, due to not feeling hungry) OR 1/2 pack of nabs; Dinner: McDonald's hamburger OR grilled chicken,pinto beans, corn, and creamed potatoes.   Nutrition Diagnosis: Nutrition-related knowledge deficit r/t weight loss AEB BMI: 49.28.  Nutrition Intervention: Nutrition rx: 1800-2000 NAS, no sugar added diet; 3 meals per day; no snacks; low calorie beverages only; physical activity as tolerated  Education/Counseling Provided: Reviewed pt diet recall. Educated pt on how excessive snacking can lead to weight gain. Attempted to educate pt on principles of energy expenditure  and how changes in diet and physical activity affect weight status. However, pt reported that he did not know what calories were and this made no sense to him. Attempted approach of plate method and a general, healthful diet that includes low fat dairy, lean meats, whole fruits and vegetables, and whole grains most often. He reported  that his method would not work for him, as he is already starving himself and refused to decrease portions any further. Most of this visit was spent on counseling. Had long discussion regardinglow, moderate weight loss (0.5-2# weight loss per week) and adopting healthy lifestyle changes vs. obtaining a certain body type or weight. Discussed bariatric surgery as a last resort option for patient. Provided pt with emotional support, as he was very angry and tearful at time of visit due to his frustrations with weight loss. Provided encouragement to work toward  a healthier lifestyle and identified positive changes that pt had already made. Provided "Weight Loss Tips" and "1800 Calorie diet" handout from [facility] Nutrition Care Manual. Teachback method used.  Understanding, Motivation, Ability to Follow Recommendations: Expect poor compliance.  Monitoring and Evaluation: Goals: 1) 0.5-2# weight loss x 1 week; 2) 3 meals per day; 3) Physical activity as tolerated  Recommendations: 1) For weight loss: 1800-200 kcals daily; 2) Limit snacks; 3) Continue with low calorie beverages; 4) Continue to be as active as possible; 5) Continue to think about bariatric surgery; 6) Strongly recommend referral to a mental health professional   F/U: D/c. Pt declines follow-up.   Berneda Piccininni A. Mayford Knife, RD, LDN 05/06/2013  Appt EndTime: 1010

## 2013-07-07 ENCOUNTER — Encounter: Payer: Self-pay | Admitting: Internal Medicine

## 2013-08-03 ENCOUNTER — Ambulatory Visit: Payer: BC Managed Care – PPO | Admitting: Gastroenterology

## 2013-09-01 ENCOUNTER — Ambulatory Visit: Payer: BC Managed Care – PPO | Admitting: Internal Medicine

## 2014-08-29 ENCOUNTER — Encounter (HOSPITAL_COMMUNITY): Payer: Self-pay | Admitting: Emergency Medicine

## 2014-08-29 ENCOUNTER — Emergency Department (HOSPITAL_COMMUNITY)
Admission: EM | Admit: 2014-08-29 | Discharge: 2014-08-29 | Discharge status: Against Medical Advice | Payer: Medicare Other | Attending: Emergency Medicine | Admitting: Emergency Medicine

## 2014-08-29 DIAGNOSIS — M546 Pain in thoracic spine: Secondary | ICD-10-CM | POA: Diagnosis present

## 2014-08-29 DIAGNOSIS — I10 Essential (primary) hypertension: Secondary | ICD-10-CM | POA: Diagnosis not present

## 2014-08-29 NOTE — ED Notes (Signed)
Pt having mid back pain, feels like muscle pulling from spine causing pain that his breath,  pt can not lay down, can sit without pain

## 2014-08-29 NOTE — ED Notes (Signed)
Patient states he has family upstairs and can't wait this long wants to leave.

## 2018-01-22 ENCOUNTER — Encounter: Payer: Self-pay | Admitting: Internal Medicine

## 2018-08-08 ENCOUNTER — Other Ambulatory Visit: Payer: Self-pay

## 2018-08-08 ENCOUNTER — Emergency Department (HOSPITAL_COMMUNITY): Payer: Medicare Other

## 2018-08-08 ENCOUNTER — Emergency Department (HOSPITAL_COMMUNITY)
Admission: EM | Admit: 2018-08-08 | Discharge: 2018-08-08 | Disposition: A | Discharge status: Home / Self Care | Payer: Medicare Other | Attending: Emergency Medicine | Admitting: Emergency Medicine

## 2018-08-08 ENCOUNTER — Encounter (HOSPITAL_COMMUNITY): Payer: Self-pay | Admitting: Emergency Medicine

## 2018-08-08 DIAGNOSIS — Z79899 Other long term (current) drug therapy: Secondary | ICD-10-CM | POA: Diagnosis not present

## 2018-08-08 DIAGNOSIS — I10 Essential (primary) hypertension: Secondary | ICD-10-CM | POA: Diagnosis not present

## 2018-08-08 DIAGNOSIS — J180 Bronchopneumonia, unspecified organism: Secondary | ICD-10-CM | POA: Diagnosis not present

## 2018-08-08 DIAGNOSIS — Z7982 Long term (current) use of aspirin: Secondary | ICD-10-CM | POA: Insufficient documentation

## 2018-08-08 DIAGNOSIS — R109 Unspecified abdominal pain: Secondary | ICD-10-CM

## 2018-08-08 DIAGNOSIS — N390 Urinary tract infection, site not specified: Secondary | ICD-10-CM | POA: Diagnosis not present

## 2018-08-08 DIAGNOSIS — R1084 Generalized abdominal pain: Secondary | ICD-10-CM | POA: Diagnosis present

## 2018-08-08 LAB — URINALYSIS, ROUTINE W REFLEX MICROSCOPIC
BILIRUBIN URINE: NEGATIVE
Bacteria, UA: NONE SEEN
GLUCOSE, UA: NEGATIVE mg/dL
Ketones, ur: NEGATIVE mg/dL
NITRITE: NEGATIVE
PH: 5 (ref 5.0–8.0)
Protein, ur: 30 mg/dL — AB
RBC / HPF: >50 RBC/hpf — ABNORMAL HIGH (ref 0–5)
SPECIFIC GRAVITY, URINE: 1.024 (ref 1.005–1.030)

## 2018-08-08 MED ORDER — HYDROMORPHONE HCL 1 MG/ML IJ SOLN
1.0000 mg | Freq: Once | INTRAMUSCULAR | Status: AC
  Administered 2018-08-08: 1 mg via INTRAVENOUS
  Filled 2018-08-08: qty 1

## 2018-08-08 MED ORDER — ONDANSETRON HCL 4 MG/2ML IJ SOLN
4.0000 mg | Freq: Once | INTRAMUSCULAR | Status: AC
  Administered 2018-08-08: 4 mg via INTRAVENOUS
  Filled 2018-08-08: qty 2

## 2018-08-08 MED ORDER — LEVOFLOXACIN 500 MG PO TABS
500.0000 mg | ORAL_TABLET | Freq: Once | ORAL | Status: AC
  Administered 2018-08-08: 500 mg via ORAL
  Filled 2018-08-08: qty 1

## 2018-08-08 MED ORDER — LEVOFLOXACIN 500 MG PO TABS: 500.0000 mg | ORAL_TABLET | Freq: Every day | ORAL | 0 refills | Status: DC

## 2018-08-08 NOTE — ED Notes (Signed)
TT to bedside

## 2018-08-08 NOTE — ED Provider Notes (Signed)
[facility] EMERGENCY DEPARTMENT Provider Note   CSN: [id number] Arrival date & time: 08/08/18  1953     History   Chief Complaint Chief Complaint  Patient presents with  . Flank Pain    HPI Tanner Meyers is a 57 y.o. male.  HPI  Tanner Meyers is a 57 y.o. male who presents to the Emergency Department complaining of sudden onset of left flank pain that began approximately 30 minutes prior to arrival.  He describes the pain as sharp and stabbing and similar to previous kidney stone pain.  He has not taken any medication for symptom relief.  He denies dysuria or decreased urination.  He states he has not noticed if his urine has been dark.  He also reports mild nausea but no vomiting.  No abdominal pain, fever, chills, chest pain or shortness of breath.  No history of injury.  He states his last kidney stone was several years ago and passed without intervention.   Past Medical History:  Diagnosis Date  . Arthritis   . GERD (gastroesophageal reflux disease)   . History of kidney stones   . Hypertension   . Shortness of breath    SOB with no exertion  . Sleep apnea    supposed to wear BiPaP machine at night, but doesn't  . Umbilical hernia     There are no active problems to display for this patient.   Past Surgical History:  Procedure Laterality Date  . BACK SURGERY  12/12/2002   [facility]  . COLONOSCOPY N/A 03/01/2013   WUJ:WJXBJYN and rectal polyps-removed as described above/Colonic diverticulosis  . ESOPHAGOGASTRODUODENOSCOPY (EGD) WITH ESOPHAGEAL DILATION     [facility]  . FOOT SURGERY Right 2009   Outpt Clinic-Unknown  . GASTRIC BYPASS    . TOTAL KNEE ARTHROPLASTY Bilateral Rt-2012, Lt-2010   [facility]  . [place] Medications    Prior to Admission medications   Medication Sig Start Date End Date Taking? Authorizing Provider  albuterol (PROVENTIL HFA;VENTOLIN HFA) 108 (90 BASE)  MCG/ACT inhaler Inhale 2 puffs into the lungs every 6 (six) hours as needed for wheezing.    [provider]  aspirin 325 MG tablet Take 650 mg by mouth once.    [provider]  losartan (COZAAR) 50 MG tablet Take 50 mg by mouth daily.    [provider]  peg 3350 powder (MOVIPREP) 100 G SOLR Take 1 kit (100 g total) by mouth as directed. Patient not taking: Reported on 08/29/2014 02/12/13   Daneil Dolin, MD  traZODone (DESYREL) 50 MG tablet Take 50 mg by mouth at bedtime as needed for sleep.     [provider]    Family History No family history on file.  Social History Social History   Tobacco Use  . Smoking status: Former Smoker    Years: 17.00    Types: Cigarettes  Substance Use Topics  . Alcohol use: No  . Drug use: No     Allergies   Patient has no known allergies.   Review of Systems Review of Systems  Constitutional: Negative for appetite change and fever.  Respiratory: Negative for shortness of breath.   Cardiovascular: Negative for chest pain.  Gastrointestinal: Positive for nausea. Negative for abdominal pain and vomiting.  Genitourinary: Positive for flank pain. Negative for decreased urine volume, discharge, dysuria, penile pain, scrotal swelling, testicular  pain and urgency.  Musculoskeletal: Negative for back pain and neck pain.  Skin: Negative for rash.  Neurological: Negative for dizziness, weakness and numbness.     Physical Exam Updated Vital Signs BP (!) 149/87 (BP Location: Right Arm)   Pulse 68   Temp 97.7 F (36.5 C) (Oral)   Resp 17   Wt 108.9 kg   SpO2 98%   BMI 37.59 kg/m   Physical Exam Vitals signs and nursing note reviewed.  Constitutional:      Appearance: He is diaphoretic.     Comments: Patient is uncomfortable appearing and mildly diaphoretic  HENT:     Head: Atraumatic.     Mouth/Throat:     Mouth: Mucous membranes are moist.     Pharynx: Oropharynx is clear.  Neck:      Musculoskeletal: Normal range of motion.  Cardiovascular:     Rate and Rhythm: Normal rate and regular rhythm.     Pulses: Normal pulses.     Heart sounds: No murmur.  Pulmonary:     Effort: Pulmonary effort is normal.     Breath sounds: Normal breath sounds.  Abdominal:     General: There is no distension.     Palpations: Abdomen is soft. There is no mass.     Tenderness: There is no abdominal tenderness. There is left CVA tenderness. There is no guarding.  Musculoskeletal: Normal range of motion.  Skin:    General: Skin is warm.     Capillary Refill: Capillary refill takes less than 2 seconds.     Findings: No rash.  Neurological:     General: No focal deficit present.     Mental Status: He is alert.     Sensory: No sensory deficit.     Gait: Gait normal.  Psychiatric:        Mood and Affect: Mood normal.      ED Treatments / Results  Labs (all labs ordered are listed, but only abnormal results are displayed) Labs Reviewed  URINALYSIS, ROUTINE W REFLEX MICROSCOPIC - Abnormal; Notable for the following components:      Result Value   APPearance CLOUDY (*)    Hgb urine dipstick LARGE (*)    Protein, ur 30 (*)    Leukocytes, UA TRACE (*)    RBC / HPF >50 (*)    All other components within normal limits  URINE CULTURE    EKG None  Radiology Ct Renal Stone Study  Result Date: 08/08/2018 CLINICAL DATA:  Left-sided flank pain beginning 30 minutes ago. History of kidney stones. EXAM: CT ABDOMEN AND PELVIS WITHOUT CONTRAST TECHNIQUE: Multidetector CT imaging of the abdomen and pelvis was performed following the standard protocol without IV contrast. COMPARISON:  03/27/2009 FINDINGS: Lower chest: Developing bronchopneumonia in the left lower lobe. Right lung base is clear. No pleural effusion. Hepatobiliary: Liver parenchyma is normal. Previous cholecystectomy. Pancreas: Normal Spleen: Normal Adrenals/Urinary Tract: Adrenal glands are normal. Simple cyst of the right kidney.  3 mm stone in the proximal ureter, but without visible hydronephrosis. Left kidney is normal. Minimal fullness of the left renal collecting system. One or 2 mm stone in the bladder, presumably passed from the left. Stomach/Bowel: No acute bowel finding.  Previous gastric bypass. Vascular/Lymphatic: Mild aortic atherosclerosis. IVC is normal. No retroperitoneal adenopathy. Reproductive: Normal Other: No free fluid or air. Musculoskeletal: Ordinary lumbar degenerative changes. IMPRESSION: Patchy bronchopneumonia developing in the left lower lobe. 3 mm stone in the proximal right ureter, but without hydroureteronephrosis. Mild  fullness of the left renal collecting system and ureter. One or 2 mm stone in the bladder, presumably having passed from the left system. Electronically Signed   By: Nelson Chimes M.D.   On: 08/08/2018 21:45    Procedures Procedures (including critical care time)  Medications Ordered in ED Medications  HYDROmorphone (DILAUDID) injection 1 mg (1 mg Intravenous Given 08/08/18 2032)  ondansetron (ZOFRAN) injection 4 mg (4 mg Intravenous Given 08/08/18 2031)  HYDROmorphone (DILAUDID) injection 1 mg (1 mg Intravenous Given 08/08/18 2104)     Initial Impression / Assessment and Plan / ED Course  I have reviewed the triage vital signs and the nursing notes.  Pertinent labs & imaging results that were available during my care of the patient were reviewed by me and considered in my medical decision making (see chart for details).    Patient with sudden onset of left flank pain and history of kidney stones.  On my initial exam he is mildly diaphoretic and pacing around in the room and appears uncomfortable.  He is nontoxic-appearing.  Vital signs are stable.  Will obtain urinalysis, CT renal stone study and give IV medications.  2210 on recheck, patient reports pain has resolved.  He is resting comfortably on the stretcher and talking on his cell phone.  I have discussed CT findings  with him.  His vital signs are stable.  No hypoxia, tachypnea or tachycardia.  He is afebrile.  On further history, he does admit to cough and nasal congestion for several days.  No dyspnea.  I feel that he is appropriate for discharge home, urine culture is pending I will start him on Levaquin and give initial dose here.  He states he has Tylenol at home for pain and does not want anything stronger.  He agrees to follow-up with his urologist and PCP.  Strict return precautions were discussed.  Final Clinical Impressions(s) / ED Diagnoses   Final diagnoses:  Bronchopneumonia  Left flank pain  Urinary tract infection in male    ED Discharge Orders    None       Kem Parkinson, Hershal Coria 08/08/18 2225    Milton Ferguson, MD 08/09/18 1547

## 2018-08-08 NOTE — ED Notes (Signed)
Call to CT after pt reports pain has shifted to his L Groin

## 2018-08-08 NOTE — ED Triage Notes (Signed)
Pt C/O left sided flank pain that began 30 minutes ago. Pt has hx of kidney stone. Pt denies urinary symptoms.

## 2018-08-08 NOTE — Discharge Instructions (Addendum)
Your work-up tonight shows that you have a left-sided pneumonia and a urinary tract infection.  You have also likely passed a kidney stone.  It is important that you drink plenty of water for the next few days, take your antibiotic as directed until its finished.  Follow-up with your primary care provider for recheck.  Return to the ER for any worsening symptoms such as fever, vomiting, or increasing pain.

## 2018-08-08 NOTE — ED Notes (Signed)
To CT

## 2018-08-11 LAB — URINE CULTURE: Culture: >=100000 — AB

## 2018-08-12 ENCOUNTER — Telehealth: Payer: Self-pay | Admitting: Emergency Medicine

## 2018-08-12 NOTE — Telephone Encounter (Signed)
Post ED Visit - Positive Culture Follow-up: Successful Patient Follow-Up  Culture assessed and recommendations reviewed by:  []  Elenor Quinones, Pharm.D. [x]  Heide Guile, Pharm.D., BCPS AQ-ID []  Parks Neptune, Pharm.D., BCPS []  Alycia Rossetti, Pharm.D., BCPS []  [place], [place].D., BCPS, AAHIVP []  Legrand Como, Pharm.D., BCPS, AAHIVP []  Salome Arnt, PharmD, BCPS []  Johnnette Gourd, PharmD, BCPS []  Hughes Better, PharmD, BCPS []  Leeroy Cha, PharmD  Positive urine culture  []  Patient discharged without antimicrobial prescription and treatment is now indicated []  Organism is resistant to prescribed ED discharge antimicrobial []  Patient with positive blood cultures  Changes discussed with ED provider: Jeannett Senior PA New antibiotic prescription continue Ciprofloxacin and start Bactrim DS 1 tab po bid x 14 days Called to [facility] [place]  Contacted patient, 08/12/2018 1344   Hazle Nordmann 08/12/2018, 1:42 PM

## 2018-08-12 NOTE — Progress Notes (Signed)
.   ED Antimicrobial Stewardship Positive Culture Follow Up   Tanner Meyers is an 57 y.o. male who presented to [facility] on 08/08/2018 with a chief complaint of  Chief Complaint  Patient presents with  . Flank Pain    Recent Results (from the past 720 hour(s))  Urine culture     Status: Abnormal   Collection Time: 08/08/18  8:21 PM  Result Value Ref Range Status   Specimen Description   Final    URINE, CLEAN CATCH Performed at [facility], [address] [place], [place] [address]    Special Requests   Final    NONE Performed at [facility], [address], [place], [place] [address]    Culture >=100,000 COLONIES/mL STAPHYLOCOCCUS EPIDERMIDIS (A)  Final   Report Status 08/11/2018 FINAL  Final   Organism ID, Bacteria STAPHYLOCOCCUS EPIDERMIDIS (A)  Final      Susceptibility   Staphylococcus epidermidis - MIC*    CIPROFLOXACIN 2 INTERMEDIATE Intermediate     GENTAMICIN 4 SENSITIVE Sensitive     NITROFURANTOIN <=16 SENSITIVE Sensitive     OXACILLIN >=4 RESISTANT Resistant     TETRACYCLINE >=16 RESISTANT Resistant     VANCOMYCIN 2 SENSITIVE Sensitive     TRIMETH/SULFA <=10 SENSITIVE Sensitive     CLINDAMYCIN 2 INTERMEDIATE Intermediate     RIFAMPIN <=0.5 SENSITIVE Sensitive     Inducible Clindamycin NEGATIVE Sensitive     * >=100,000 COLONIES/mL STAPHYLOCOCCUS EPIDERMIDIS    [x]  Treated with ciprofloxacin, organism resistant to prescribed antimicrobial  New antibiotic prescription: Bactrim DS 1 tab bid x 14 days  Please advise patient to continue and complete the course of ciprofloxacin for his bronchopneumonia. But he also needs to start and complete a course of Bactrim as prescribed above  ED Provider: Graylon Gunning 08/12/2018, 9:55 AM Clinical Pharmacist

## 2018-08-24 ENCOUNTER — Other Ambulatory Visit: Payer: Self-pay

## 2018-08-24 ENCOUNTER — Encounter (HOSPITAL_COMMUNITY): Payer: Self-pay | Admitting: *Deleted

## 2018-08-24 ENCOUNTER — Emergency Department (HOSPITAL_COMMUNITY)
Admission: EM | Admit: 2018-08-24 | Discharge: 2018-08-25 | Disposition: A | Discharge status: Home / Self Care | Payer: Medicare Other | Attending: Emergency Medicine | Admitting: Emergency Medicine

## 2018-08-24 DIAGNOSIS — Z87891 Personal history of nicotine dependence: Secondary | ICD-10-CM | POA: Insufficient documentation

## 2018-08-24 DIAGNOSIS — Z79899 Other long term (current) drug therapy: Secondary | ICD-10-CM | POA: Insufficient documentation

## 2018-08-24 DIAGNOSIS — I10 Essential (primary) hypertension: Secondary | ICD-10-CM | POA: Insufficient documentation

## 2018-08-24 DIAGNOSIS — R109 Unspecified abdominal pain: Secondary | ICD-10-CM | POA: Diagnosis present

## 2018-08-24 DIAGNOSIS — N200 Calculus of kidney: Secondary | ICD-10-CM

## 2018-08-24 HISTORY — DX: Calculus of kidney: N20.0

## 2018-08-24 LAB — URINALYSIS, ROUTINE W REFLEX MICROSCOPIC
BILIRUBIN URINE: NEGATIVE
Bacteria, UA: NONE SEEN
Glucose, UA: NEGATIVE mg/dL
Hgb urine dipstick: NEGATIVE
KETONES UR: NEGATIVE mg/dL
Nitrite: NEGATIVE
PROTEIN: NEGATIVE mg/dL
Specific Gravity, Urine: 1.02 (ref 1.005–1.030)
pH: 5 (ref 5.0–8.0)

## 2018-08-24 MED ORDER — HYDROMORPHONE HCL 2 MG/ML IJ SOLN
2.0000 mg | Freq: Once | INTRAMUSCULAR | Status: AC
  Administered 2018-08-24: 2 mg via INTRAMUSCULAR
  Filled 2018-08-24: qty 1

## 2018-08-24 MED ORDER — KETOROLAC TROMETHAMINE 60 MG/2ML IM SOLN
60.0000 mg | Freq: Once | INTRAMUSCULAR | Status: AC
  Administered 2018-08-24: 60 mg via INTRAMUSCULAR
  Filled 2018-08-24: qty 2

## 2018-08-24 NOTE — ED Notes (Signed)
Urine taken to lab

## 2018-08-24 NOTE — ED Triage Notes (Signed)
Pt c/o right side flank pain that started tonight, has hx of kidney stones,

## 2018-08-24 NOTE — ED Provider Notes (Signed)
[facility] EMERGENCY DEPARTMENT Provider Note   CSN: [id number] Arrival date & time: 08/24/18  2150     History   Chief Complaint Chief Complaint  Patient presents with  . Flank Pain    HPI Tanner Meyers is a 58 y.o. male.  Patient is a 58 year old male with history of renal calculi.  He presents today with complaints of right flank pain.  This has been occurring intermittently for the past 2 weeks, however became worse this evening.  He denies any fevers or chills.  He denies any dysuria or bloody stool.  The history is provided by the patient.  Flank Pain  This is a recurrent problem. Episode onset: Earlier today. The problem occurs constantly. Pertinent negatives include no abdominal pain. Nothing aggravates the symptoms. Nothing relieves the symptoms. He has tried nothing for the symptoms.    Past Medical History:  Diagnosis Date  . Arthritis   . GERD (gastroesophageal reflux disease)   . History of kidney stones   . Hypertension   . Kidney stone   . Shortness of breath    SOB with no exertion  . Sleep apnea    supposed to wear BiPaP machine at night, but doesn't  . Umbilical hernia     There are no active problems to display for this patient.   Past Surgical History:  Procedure Laterality Date  . BACK SURGERY  12/12/2002   [facility]  . COLONOSCOPY N/A 03/01/2013   HER:DEYCXKG and rectal polyps-removed as described above/Colonic diverticulosis  . ESOPHAGOGASTRODUODENOSCOPY (EGD) WITH ESOPHAGEAL DILATION     [facility]  . FOOT SURGERY Right 2009   [facility]  . GASTRIC BYPASS    . TOTAL KNEE ARTHROPLASTY Bilateral Rt-2012, Lt-2010   [facility]  . [place] Medications    Prior to Admission medications   Medication Sig Start Date End Date Taking? Authorizing Provider  albuterol (PROVENTIL HFA;VENTOLIN HFA) 108 (90 BASE) MCG/ACT inhaler Inhale 2 puffs into the lungs  every 6 (six) hours as needed for wheezing.    [provider]  aspirin 325 MG tablet Take 650 mg by mouth once.    [provider]  levofloxacin (LEVAQUIN) 500 MG tablet Take 1 tablet (500 mg total) by mouth daily. 08/08/18   Triplett, Tammy, PA-C  losartan (COZAAR) 50 MG tablet Take 50 mg by mouth daily.    [provider]  peg 3350 powder (MOVIPREP) 100 G SOLR Take 1 kit (100 g total) by mouth as directed. Patient not taking: Reported on 08/29/2014 02/12/13   Daneil Dolin, MD  traZODone (DESYREL) 50 MG tablet Take 50 mg by mouth at bedtime as needed for sleep.     [provider]    Family History No family history on file.  Social History Social History   Tobacco Use  . Smoking status: Former Smoker    Years: 17.00    Types: Cigarettes  . Smokeless tobacco: Never Used  Substance Use Topics  . Alcohol use: No  . Drug use: No     Allergies   Patient has no known allergies.   Review of Systems Review of Systems  Gastrointestinal: Negative for abdominal pain.  Genitourinary: Positive for flank pain.  All other systems reviewed and are negative.    Physical Exam Updated Vital Signs BP 138/77   Pulse (!) 53  Temp 98.4 F (36.9 C) (Oral)   Resp 20   Ht 5' 7.5" (1.715 m)   Wt 108.9 kg   SpO2 99%   BMI 37.03 kg/m   Physical Exam Vitals signs and nursing note reviewed.  Constitutional:      General: He is not in acute distress.    Appearance: Normal appearance. He is well-developed. He is not diaphoretic.  HENT:     Head: Normocephalic and atraumatic.  Neck:     Musculoskeletal: Normal range of motion and neck supple.  Cardiovascular:     Rate and Rhythm: Normal rate and regular rhythm.     Heart sounds: No murmur. No friction rub.  Pulmonary:     Effort: Pulmonary effort is normal. No respiratory distress.     Breath sounds: Normal breath sounds. No wheezing or rales.  Abdominal:     General: Bowel sounds are normal.  There is no distension.     Palpations: Abdomen is soft.     Tenderness: There is no abdominal tenderness. There is right CVA tenderness.  Musculoskeletal: Normal range of motion.  Skin:    General: Skin is warm and dry.  Neurological:     Mental Status: He is alert and oriented to person, place, and time.     Coordination: Coordination normal.      ED Treatments / Results  Labs (all labs ordered are listed, but only abnormal results are displayed) Labs Reviewed  URINALYSIS, ROUTINE W REFLEX MICROSCOPIC - Abnormal; Notable for the following components:      Result Value   APPearance HAZY (*)    Leukocytes, UA SMALL (*)    All other components within normal limits    EKG None  Radiology No results found.  Procedures Procedures (including critical care time)  Medications Ordered in ED Medications  ketorolac (TORADOL) injection 60 mg (has no administration in time range)  HYDROmorphone (DILAUDID) injection 2 mg (has no administration in time range)     Initial Impression / Assessment and Plan / ED Course  I have reviewed the triage vital signs and the nursing notes.  Pertinent labs & imaging results that were available during my care of the patient were reviewed by me and considered in my medical decision making (see chart for details).   Patient feeling better after medications given in the ER.  Review of his CT scan from 2 weeks ago reveals a stone in the proximal ureter.  Patient to follow-up with urology if not improving in the next few days.  Urine shows no infection, there is no fever, and he is not toxic appearing.  Final Clinical Impressions(s) / ED Diagnoses   Final diagnoses:  None    ED Discharge Orders    None       Veryl Speak, MD 08/25/18 [phone]

## 2018-08-25 NOTE — Discharge Instructions (Signed)
Follow-up with urology if symptoms are not improving in the next few days.

## 2018-09-15 ENCOUNTER — Encounter (HOSPITAL_COMMUNITY): Payer: Self-pay | Admitting: Emergency Medicine

## 2018-09-15 ENCOUNTER — Emergency Department (HOSPITAL_COMMUNITY): Payer: Medicare Other

## 2018-09-15 ENCOUNTER — Other Ambulatory Visit: Payer: Self-pay

## 2018-09-15 ENCOUNTER — Emergency Department (HOSPITAL_COMMUNITY)
Admission: EM | Admit: 2018-09-15 | Discharge: 2018-09-15 | Disposition: A | Discharge status: Home / Self Care | Payer: Medicare Other | Attending: Emergency Medicine | Admitting: Emergency Medicine

## 2018-09-15 DIAGNOSIS — N201 Calculus of ureter: Secondary | ICD-10-CM | POA: Diagnosis not present

## 2018-09-15 DIAGNOSIS — R1084 Generalized abdominal pain: Secondary | ICD-10-CM | POA: Diagnosis present

## 2018-09-15 DIAGNOSIS — Z7982 Long term (current) use of aspirin: Secondary | ICD-10-CM | POA: Insufficient documentation

## 2018-09-15 DIAGNOSIS — R109 Unspecified abdominal pain: Secondary | ICD-10-CM

## 2018-09-15 DIAGNOSIS — Z87891 Personal history of nicotine dependence: Secondary | ICD-10-CM | POA: Diagnosis not present

## 2018-09-15 DIAGNOSIS — I10 Essential (primary) hypertension: Secondary | ICD-10-CM | POA: Insufficient documentation

## 2018-09-15 LAB — CBC WITH DIFFERENTIAL/PLATELET
ABS IMMATURE GRANULOCYTES: 0.03 10*3/uL (ref 0.00–0.07)
BASOS PCT: 0 %
Basophils Absolute: 0.1 10*3/uL (ref 0.0–0.1)
EOS ABS: 0.1 10*3/uL (ref 0.0–0.5)
Eosinophils Relative: 1 %
HCT: 41.3 % (ref 39.0–52.0)
Hemoglobin: 13 g/dL (ref 13.0–17.0)
IMMATURE GRANULOCYTES: 0 %
Lymphocytes Relative: 16 %
Lymphs Abs: 1.8 10*3/uL (ref 0.7–4.0)
MCH: 27 pg (ref 26.0–34.0)
MCHC: 31.5 g/dL (ref 30.0–36.0)
MCV: 85.9 fL (ref 80.0–100.0)
MONO ABS: 0.8 10*3/uL (ref 0.1–1.0)
Monocytes Relative: 7 %
NEUTROS ABS: 8.5 10*3/uL — AB (ref 1.7–7.7)
NEUTROS PCT: 76 %
PLATELETS: 248 10*3/uL (ref 150–400)
RBC: 4.81 MIL/uL (ref 4.22–5.81)
RDW: 12.8 % (ref 11.5–15.5)
WBC: 11.3 10*3/uL — AB (ref 4.0–10.5)
nRBC: 0 % (ref 0.0–0.2)

## 2018-09-15 LAB — COMPREHENSIVE METABOLIC PANEL
ALT: 18 U/L (ref 0–44)
AST: 14 U/L — ABNORMAL LOW (ref 15–41)
Albumin: 3.7 g/dL (ref 3.5–5.0)
Alkaline Phosphatase: 97 U/L (ref 38–126)
Anion gap: 7 (ref 5–15)
BUN: 16 mg/dL (ref 6–20)
CHLORIDE: 105 mmol/L (ref 98–111)
CO2: 27 mmol/L (ref 22–32)
CREATININE: 1 mg/dL (ref 0.61–1.24)
Calcium: 8.6 mg/dL — ABNORMAL LOW (ref 8.9–10.3)
Glucose, Bld: 93 mg/dL (ref 70–99)
Potassium: 3.3 mmol/L — ABNORMAL LOW (ref 3.5–5.1)
SODIUM: 139 mmol/L (ref 135–145)
Total Bilirubin: 0.4 mg/dL (ref 0.3–1.2)
Total Protein: 7 g/dL (ref 6.5–8.1)

## 2018-09-15 LAB — URINALYSIS, ROUTINE W REFLEX MICROSCOPIC
Bacteria, UA: NONE SEEN
Bilirubin Urine: NEGATIVE
Glucose, UA: NEGATIVE mg/dL
KETONES UR: NEGATIVE mg/dL
LEUKOCYTES UA: NEGATIVE
Nitrite: NEGATIVE
PH: 6 (ref 5.0–8.0)
PROTEIN: NEGATIVE mg/dL
RBC / HPF: >50 RBC/hpf — ABNORMAL HIGH (ref 0–5)
SPECIFIC GRAVITY, URINE: 1.014 (ref 1.005–1.030)

## 2018-09-15 MED ORDER — TAMSULOSIN HCL 0.4 MG PO CAPS: 0.4000 mg | ORAL_CAPSULE | Freq: Every day | ORAL | 0 refills | Status: AC

## 2018-09-15 MED ORDER — ONDANSETRON HCL 4 MG/2ML IJ SOLN
4.0000 mg | Freq: Once | INTRAMUSCULAR | Status: AC
  Administered 2018-09-15: 4 mg via INTRAVENOUS
  Filled 2018-09-15: qty 2

## 2018-09-15 MED ORDER — KETOROLAC TROMETHAMINE 30 MG/ML IJ SOLN
30.0000 mg | Freq: Once | INTRAMUSCULAR | Status: AC
  Administered 2018-09-15: 30 mg via INTRAVENOUS
  Filled 2018-09-15: qty 1

## 2018-09-15 MED ORDER — OXYCODONE-ACETAMINOPHEN 5-325 MG PO TABS: 1.0000 | ORAL_TABLET | Freq: Four times a day (QID) | ORAL | 0 refills | Status: DC | PRN

## 2018-09-15 MED ORDER — ONDANSETRON 4 MG PO TBDP: ORAL_TABLET | ORAL | 0 refills | Status: DC

## 2018-09-15 NOTE — ED Triage Notes (Signed)
Pt C/O right sided flank pain that started 30 minutes ago. Pt states he only urinates "a little bit at a time."

## 2018-09-15 NOTE — ED Provider Notes (Signed)
[facility] EMERGENCY DEPARTMENT Provider Note   CSN: [id number] Arrival date & time: 09/15/18  2106     History   Chief Complaint Chief Complaint  Patient presents with  . Flank Pain    HPI Tanner Meyers is a 58 y.o. male.  Patient complains of right flank pain and right groin pain.  Patient has a history of kidney stones and thinks he may have another 1  The history is provided by the patient.  Flank Pain  This is a new problem. The current episode started 12 to 24 hours ago. The problem occurs constantly. The problem has not changed since onset.Pertinent negatives include no chest pain, no abdominal pain and no headaches. Nothing aggravates the symptoms. Nothing relieves the symptoms. He has tried nothing for the symptoms. The treatment provided no relief.    Past Medical History:  Diagnosis Date  . Arthritis   . GERD (gastroesophageal reflux disease)   . History of kidney stones   . Hypertension   . Kidney stone   . Shortness of breath    SOB with no exertion  . Sleep apnea    supposed to wear BiPaP machine at night, but doesn't  . Umbilical hernia     There are no active problems to display for this patient.   Past Surgical History:  Procedure Laterality Date  . BACK SURGERY  12/12/2002   [facility]  . COLONOSCOPY N/A 03/01/2013   QXI:[id number] and rectal polyps-removed as described above/Colonic diverticulosis  . ESOPHAGOGASTRODUODENOSCOPY (EGD) WITH ESOPHAGEAL DILATION     [facility]  . FOOT SURGERY Right 2009   [facility]  . GASTRIC BYPASS    . TOTAL KNEE ARTHROPLASTY Bilateral Rt-2012, Lt-2010   [facility]  . [place] Medications    Prior to Admission medications   Medication Sig Start Date End Date Taking? Authorizing Provider  albuterol (PROVENTIL HFA;VENTOLIN HFA) 108 (90 BASE) MCG/ACT inhaler Inhale 2 puffs into the lungs every 6 (six) hours as needed for  wheezing.    [provider]  aspirin 325 MG tablet Take 650 mg by mouth once.    [provider]  levofloxacin (LEVAQUIN) 500 MG tablet Take 1 tablet (500 mg total) by mouth daily. 08/08/18   Triplett, Tammy, PA-C  losartan (COZAAR) 50 MG tablet Take 50 mg by mouth daily.    [provider]  ondansetron (ZOFRAN ODT) 4 MG disintegrating tablet 41m ODT q4 hours prn nausea/vomit 09/15/18   ZMilton Ferguson MD  oxyCODONE-acetaminophen (PERCOCET/ROXICET) 5-325 MG tablet Take 1 tablet by mouth every 6 (six) hours as needed. 09/15/18   ZMilton Ferguson MD  peg 3350 powder (MOVIPREP) 100 G SOLR Take 1 kit (100 g total) by mouth as directed. Patient not taking: Reported on 08/29/2014 02/12/13   RDaneil Dolin MD  tamsulosin (FLOMAX) 0.4 MG CAPS capsule Take 1 capsule (0.4 mg total) by mouth daily. 09/15/18   ZMilton Ferguson MD  traZODone (DESYREL) 50 MG tablet Take 50 mg by mouth at bedtime as needed for sleep.     [provider]    Family History No family history on file.  Social History Social History   Tobacco Use  . Smoking status: Former Smoker    Years: 17.00    Types: Cigarettes  . Smokeless tobacco: Never Used  Substance Use Topics  . Alcohol use: No  .  Drug use: No     Allergies   Patient has no known allergies.   Review of Systems Review of Systems  Constitutional: Negative for appetite change and fatigue.  HENT: Negative for congestion, ear discharge and sinus pressure.   Eyes: Negative for discharge.  Respiratory: Negative for cough.   Cardiovascular: Negative for chest pain.  Gastrointestinal: Negative for abdominal pain and diarrhea.  Genitourinary: Positive for flank pain. Negative for frequency and hematuria.  Musculoskeletal: Negative for back pain.  Skin: Negative for rash.  Neurological: Negative for seizures and headaches.  Psychiatric/Behavioral: Negative for hallucinations.     Physical Exam Updated Vital Signs BP (!)  162/95 (BP Location: Right Arm)   Pulse (!) 52   Temp 97.7 F (36.5 C) (Oral)   Resp 16   SpO2 100%   Physical Exam Vitals signs and nursing note reviewed.  Constitutional:      Appearance: He is well-developed.  HENT:     Head: Normocephalic.     Nose: Nose normal.  Eyes:     General: No scleral icterus.    Conjunctiva/sclera: Conjunctivae normal.  Neck:     Musculoskeletal: Neck supple.     Thyroid: No thyromegaly.  Cardiovascular:     Rate and Rhythm: Normal rate and regular rhythm.     Heart sounds: No murmur. No friction rub. No gallop.   Pulmonary:     Breath sounds: No stridor. No wheezing or rales.  Chest:     Chest wall: No tenderness.  Abdominal:     General: There is no distension.     Tenderness: There is no abdominal tenderness. There is no rebound.  Musculoskeletal: Normal range of motion.  Lymphadenopathy:     Cervical: No cervical adenopathy.  Skin:    Findings: No erythema or rash.  Neurological:     Mental Status: He is oriented to person, place, and time.     Motor: No abnormal muscle tone.     Coordination: Coordination normal.  Psychiatric:        Behavior: Behavior normal.      ED Treatments / Results  Labs (all labs ordered are listed, but only abnormal results are displayed) Labs Reviewed  URINALYSIS, ROUTINE W REFLEX MICROSCOPIC - Abnormal; Notable for the following components:      Result Value   APPearance HAZY (*)    Hgb urine dipstick LARGE (*)    RBC / HPF >50 (*)    All other components within normal limits  CBC WITH DIFFERENTIAL/PLATELET - Abnormal; Notable for the following components:   WBC 11.3 (*)    Neutro Abs 8.5 (*)    All other components within normal limits  COMPREHENSIVE METABOLIC PANEL - Abnormal; Notable for the following components:   Potassium 3.3 (*)    Calcium 8.6 (*)    AST 14 (*)    All other components within normal limits    EKG None  Radiology Ct Renal Stone Study  Result Date:  09/15/2018 CLINICAL DATA:  Right flank pain. EXAM: CT ABDOMEN AND PELVIS WITHOUT CONTRAST TECHNIQUE: Multidetector CT imaging of the abdomen and pelvis was performed following the standard protocol without IV contrast. COMPARISON:  Most recent CT 08/08/2018 FINDINGS: Lower chest: Resolved left lung base opacities from prior exam. Hepatobiliary: No focal liver abnormality is seen. Status post cholecystectomy. No biliary dilatation. Pancreas: No ductal dilatation or inflammation. Spleen: Normal in size without focal abnormality. Adrenals/Urinary Tract: Normal adrenal glands. Obstructing 5 mm stone at the right ureterovesicular  junction with moderate hydroureteronephrosis. Mild right perinephric edema. Additional punctate stone in the lower right kidney. No left hydronephrosis or urolithiasis. The left ureter is decompressed. Urinary bladder is partially distended. No intraluminal bladder stone. Stomach/Bowel: Post gastric bypass. Small amount of fluid in the excluded gastric remnant. Roux limb is unremarkable. No bowel wall thickening or inflammatory change. No obstruction. Colonic diverticulosis without diverticulitis. The appendix is not well visualized. Vascular/Lymphatic: Normal caliber abdominal aorta. No enlarged lymph nodes in the abdomen or pelvis. Patchy edema in the central small bowel mesentery with multiple small lymph nodes, unchanged from prior exam. Reproductive: Central prostatic calcifications. Other: Postsurgical change inj of the anterior abdominal wall with rectus diastasis and fat containing complex ventral abdominal wall hernias. No free air or free fluid. Musculoskeletal: Multilevel degenerative change throughout the spine. There are no acute or suspicious osseous abnormalities. IMPRESSION: 1. Obstructing 5 mm stone at the right ureterovesicular junction with moderate hydroureteronephrosis. 2. Additional punctate nonobstructing stone in the lower right kidney. 3. Post gastric bypass without  complication. 4. Colonic diverticulosis without diverticulitis. Electronically Signed   By: Keith Rake M.D.   On: 09/15/2018 22:43    Procedures Procedures (including critical care time)  Medications Ordered in ED Medications  ketorolac (TORADOL) 30 MG/ML injection 30 mg (30 mg Intravenous Given 09/15/18 2205)  ondansetron (ZOFRAN) injection 4 mg (4 mg Intravenous Given 09/15/18 2205)     Initial Impression / Assessment and Plan / ED Course  I have reviewed the triage vital signs and the nursing notes.  Pertinent labs & imaging results that were available during my care of the patient were reviewed by me and considered in my medical decision making (see chart for details).     CT shows 5 mm stone.  Patient sent home with Percocet Flomax Zofran and will follow-up with urology  Final Clinical Impressions(s) / ED Diagnoses   Final diagnoses:  Flank pain    ED Discharge Orders         Ordered    tamsulosin (FLOMAX) 0.4 MG CAPS capsule  Daily     09/15/18 2258    oxyCODONE-acetaminophen (PERCOCET/ROXICET) 5-325 MG tablet  Every 6 hours PRN     09/15/18 2258    ondansetron (ZOFRAN ODT) 4 MG disintegrating tablet     09/15/18 2258           Milton Ferguson, MD 09/15/18 2300

## 2018-09-15 NOTE — Discharge Instructions (Addendum)
Follow-up with [facility] this week for recheck

## 2019-03-09 ENCOUNTER — Other Ambulatory Visit: Payer: Self-pay

## 2019-03-09 ENCOUNTER — Emergency Department (HOSPITAL_COMMUNITY)
Admission: EM | Admit: 2019-03-09 | Discharge: 2019-03-09 | Discharge status: Against Medical Advice | Payer: Medicare Other

## 2019-09-18 DIAGNOSIS — R300 Dysuria: Secondary | ICD-10-CM | POA: Diagnosis not present

## 2019-09-18 DIAGNOSIS — Z87891 Personal history of nicotine dependence: Secondary | ICD-10-CM | POA: Diagnosis not present

## 2019-09-18 DIAGNOSIS — Z9884 Bariatric surgery status: Secondary | ICD-10-CM | POA: Diagnosis not present

## 2019-09-18 DIAGNOSIS — G2581 Restless legs syndrome: Secondary | ICD-10-CM | POA: Diagnosis not present

## 2019-09-18 DIAGNOSIS — N201 Calculus of ureter: Secondary | ICD-10-CM | POA: Diagnosis not present

## 2019-09-18 DIAGNOSIS — Z87442 Personal history of urinary calculi: Secondary | ICD-10-CM | POA: Diagnosis not present

## 2019-09-18 DIAGNOSIS — K219 Gastro-esophageal reflux disease without esophagitis: Secondary | ICD-10-CM | POA: Diagnosis not present

## 2019-09-18 DIAGNOSIS — E119 Type 2 diabetes mellitus without complications: Secondary | ICD-10-CM | POA: Diagnosis not present

## 2019-09-18 DIAGNOSIS — I10 Essential (primary) hypertension: Secondary | ICD-10-CM | POA: Diagnosis not present

## 2019-09-18 DIAGNOSIS — Z79899 Other long term (current) drug therapy: Secondary | ICD-10-CM | POA: Diagnosis not present

## 2019-09-18 DIAGNOSIS — M1990 Unspecified osteoarthritis, unspecified site: Secondary | ICD-10-CM | POA: Diagnosis not present

## 2019-09-20 DIAGNOSIS — Z87442 Personal history of urinary calculi: Secondary | ICD-10-CM | POA: Diagnosis not present

## 2019-09-20 DIAGNOSIS — R310 Gross hematuria: Secondary | ICD-10-CM | POA: Diagnosis not present

## 2019-09-28 DIAGNOSIS — N200 Calculus of kidney: Secondary | ICD-10-CM | POA: Diagnosis not present

## 2019-09-28 DIAGNOSIS — R310 Gross hematuria: Secondary | ICD-10-CM | POA: Diagnosis not present

## 2019-10-12 DIAGNOSIS — Z87891 Personal history of nicotine dependence: Secondary | ICD-10-CM | POA: Diagnosis not present

## 2019-10-12 DIAGNOSIS — G8911 Acute pain due to trauma: Secondary | ICD-10-CM | POA: Diagnosis not present

## 2019-10-12 DIAGNOSIS — Z043 Encounter for examination and observation following other accident: Secondary | ICD-10-CM | POA: Diagnosis not present

## 2019-10-12 DIAGNOSIS — I10 Essential (primary) hypertension: Secondary | ICD-10-CM | POA: Diagnosis not present

## 2019-10-12 DIAGNOSIS — E785 Hyperlipidemia, unspecified: Secondary | ICD-10-CM | POA: Diagnosis not present

## 2019-10-12 DIAGNOSIS — Z87898 Personal history of other specified conditions: Secondary | ICD-10-CM | POA: Diagnosis not present

## 2019-10-12 DIAGNOSIS — Z9889 Other specified postprocedural states: Secondary | ICD-10-CM | POA: Diagnosis not present

## 2019-10-12 DIAGNOSIS — S61217A Laceration without foreign body of left little finger without damage to nail, initial encounter: Secondary | ICD-10-CM | POA: Diagnosis not present

## 2019-10-12 DIAGNOSIS — E119 Type 2 diabetes mellitus without complications: Secondary | ICD-10-CM | POA: Diagnosis not present

## 2019-10-12 DIAGNOSIS — Z79899 Other long term (current) drug therapy: Secondary | ICD-10-CM | POA: Diagnosis not present

## 2019-10-21 DIAGNOSIS — R310 Gross hematuria: Secondary | ICD-10-CM | POA: Diagnosis not present

## 2019-11-08 DIAGNOSIS — M5136 Other intervertebral disc degeneration, lumbar region: Secondary | ICD-10-CM | POA: Diagnosis not present

## 2019-11-08 DIAGNOSIS — Z87442 Personal history of urinary calculi: Secondary | ICD-10-CM | POA: Diagnosis not present

## 2019-11-08 DIAGNOSIS — R1084 Generalized abdominal pain: Secondary | ICD-10-CM | POA: Diagnosis not present

## 2019-11-16 DIAGNOSIS — R030 Elevated blood-pressure reading, without diagnosis of hypertension: Secondary | ICD-10-CM | POA: Diagnosis not present

## 2019-11-16 DIAGNOSIS — Z79899 Other long term (current) drug therapy: Secondary | ICD-10-CM | POA: Diagnosis not present

## 2019-11-16 DIAGNOSIS — E559 Vitamin D deficiency, unspecified: Secondary | ICD-10-CM | POA: Diagnosis not present

## 2019-12-06 DIAGNOSIS — K432 Incisional hernia without obstruction or gangrene: Secondary | ICD-10-CM | POA: Diagnosis not present

## 2019-12-10 DIAGNOSIS — K5730 Diverticulosis of large intestine without perforation or abscess without bleeding: Secondary | ICD-10-CM | POA: Diagnosis not present

## 2019-12-10 DIAGNOSIS — Z9049 Acquired absence of other specified parts of digestive tract: Secondary | ICD-10-CM | POA: Diagnosis not present

## 2019-12-10 DIAGNOSIS — N281 Cyst of kidney, acquired: Secondary | ICD-10-CM | POA: Diagnosis not present

## 2019-12-10 DIAGNOSIS — K432 Incisional hernia without obstruction or gangrene: Secondary | ICD-10-CM | POA: Diagnosis not present

## 2019-12-10 DIAGNOSIS — K439 Ventral hernia without obstruction or gangrene: Secondary | ICD-10-CM | POA: Diagnosis not present

## 2019-12-27 DIAGNOSIS — G8929 Other chronic pain: Secondary | ICD-10-CM | POA: Diagnosis not present

## 2019-12-27 DIAGNOSIS — Z96659 Presence of unspecified artificial knee joint: Secondary | ICD-10-CM | POA: Diagnosis not present

## 2019-12-27 DIAGNOSIS — J449 Chronic obstructive pulmonary disease, unspecified: Secondary | ICD-10-CM | POA: Diagnosis not present

## 2019-12-27 DIAGNOSIS — M545 Low back pain: Secondary | ICD-10-CM | POA: Diagnosis not present

## 2019-12-28 DIAGNOSIS — Z0000 Encounter for general adult medical examination without abnormal findings: Secondary | ICD-10-CM | POA: Diagnosis not present

## 2019-12-29 DIAGNOSIS — Z9884 Bariatric surgery status: Secondary | ICD-10-CM | POA: Diagnosis not present

## 2019-12-29 DIAGNOSIS — K912 Postsurgical malabsorption, not elsewhere classified: Secondary | ICD-10-CM | POA: Diagnosis not present

## 2020-01-25 DIAGNOSIS — Z79899 Other long term (current) drug therapy: Secondary | ICD-10-CM | POA: Diagnosis not present

## 2020-01-25 DIAGNOSIS — G2581 Restless legs syndrome: Secondary | ICD-10-CM | POA: Diagnosis not present

## 2020-01-25 DIAGNOSIS — K219 Gastro-esophageal reflux disease without esophagitis: Secondary | ICD-10-CM | POA: Diagnosis not present

## 2020-01-25 DIAGNOSIS — Z87442 Personal history of urinary calculi: Secondary | ICD-10-CM | POA: Diagnosis not present

## 2020-01-25 DIAGNOSIS — G4730 Sleep apnea, unspecified: Secondary | ICD-10-CM | POA: Diagnosis not present

## 2020-01-25 DIAGNOSIS — S61213A Laceration without foreign body of left middle finger without damage to nail, initial encounter: Secondary | ICD-10-CM | POA: Diagnosis not present

## 2020-01-25 DIAGNOSIS — Z9989 Dependence on other enabling machines and devices: Secondary | ICD-10-CM | POA: Diagnosis not present

## 2020-01-25 DIAGNOSIS — E785 Hyperlipidemia, unspecified: Secondary | ICD-10-CM | POA: Diagnosis not present

## 2020-01-25 DIAGNOSIS — I10 Essential (primary) hypertension: Secondary | ICD-10-CM | POA: Diagnosis not present

## 2020-01-25 DIAGNOSIS — Z23 Encounter for immunization: Secondary | ICD-10-CM | POA: Diagnosis not present

## 2020-01-25 DIAGNOSIS — G8911 Acute pain due to trauma: Secondary | ICD-10-CM | POA: Diagnosis not present

## 2020-01-25 DIAGNOSIS — E119 Type 2 diabetes mellitus without complications: Secondary | ICD-10-CM | POA: Diagnosis not present

## 2020-01-25 DIAGNOSIS — M1990 Unspecified osteoarthritis, unspecified site: Secondary | ICD-10-CM | POA: Diagnosis not present

## 2020-01-25 DIAGNOSIS — Z87891 Personal history of nicotine dependence: Secondary | ICD-10-CM | POA: Diagnosis not present

## 2020-01-26 DIAGNOSIS — S61213A Laceration without foreign body of left middle finger without damage to nail, initial encounter: Secondary | ICD-10-CM | POA: Diagnosis not present

## 2020-01-26 DIAGNOSIS — W25XXXA Contact with sharp glass, initial encounter: Secondary | ICD-10-CM | POA: Diagnosis not present

## 2020-02-01 DIAGNOSIS — S90121A Contusion of right lesser toe(s) without damage to nail, initial encounter: Secondary | ICD-10-CM | POA: Diagnosis not present

## 2020-02-11 DIAGNOSIS — Z87891 Personal history of nicotine dependence: Secondary | ICD-10-CM | POA: Diagnosis not present

## 2020-02-11 DIAGNOSIS — I10 Essential (primary) hypertension: Secondary | ICD-10-CM | POA: Diagnosis not present

## 2020-02-11 DIAGNOSIS — E785 Hyperlipidemia, unspecified: Secondary | ICD-10-CM | POA: Diagnosis not present

## 2020-02-11 DIAGNOSIS — K219 Gastro-esophageal reflux disease without esophagitis: Secondary | ICD-10-CM | POA: Diagnosis not present

## 2020-02-11 DIAGNOSIS — S90222A Contusion of left lesser toe(s) with damage to nail, initial encounter: Secondary | ICD-10-CM | POA: Diagnosis not present

## 2020-02-11 DIAGNOSIS — S1086XA Insect bite of other specified part of neck, initial encounter: Secondary | ICD-10-CM | POA: Diagnosis not present

## 2020-02-11 DIAGNOSIS — Z96653 Presence of artificial knee joint, bilateral: Secondary | ICD-10-CM | POA: Diagnosis not present

## 2020-03-23 DIAGNOSIS — Z96659 Presence of unspecified artificial knee joint: Secondary | ICD-10-CM | POA: Diagnosis not present

## 2020-03-23 DIAGNOSIS — G4733 Obstructive sleep apnea (adult) (pediatric): Secondary | ICD-10-CM | POA: Diagnosis not present

## 2020-03-23 DIAGNOSIS — E1169 Type 2 diabetes mellitus with other specified complication: Secondary | ICD-10-CM | POA: Diagnosis not present

## 2020-03-23 DIAGNOSIS — Z9884 Bariatric surgery status: Secondary | ICD-10-CM | POA: Diagnosis not present

## 2020-03-23 DIAGNOSIS — E785 Hyperlipidemia, unspecified: Secondary | ICD-10-CM | POA: Diagnosis not present

## 2020-03-27 DIAGNOSIS — E785 Hyperlipidemia, unspecified: Secondary | ICD-10-CM | POA: Diagnosis not present

## 2020-03-27 DIAGNOSIS — E559 Vitamin D deficiency, unspecified: Secondary | ICD-10-CM | POA: Diagnosis not present

## 2020-03-27 DIAGNOSIS — Z9884 Bariatric surgery status: Secondary | ICD-10-CM | POA: Diagnosis not present

## 2020-03-27 DIAGNOSIS — M85872 Other specified disorders of bone density and structure, left ankle and foot: Secondary | ICD-10-CM | POA: Diagnosis not present

## 2020-03-27 DIAGNOSIS — E1169 Type 2 diabetes mellitus with other specified complication: Secondary | ICD-10-CM | POA: Diagnosis not present

## 2020-05-02 DIAGNOSIS — K912 Postsurgical malabsorption, not elsewhere classified: Secondary | ICD-10-CM | POA: Diagnosis not present

## 2020-05-02 DIAGNOSIS — E559 Vitamin D deficiency, unspecified: Secondary | ICD-10-CM | POA: Diagnosis not present

## 2020-05-02 DIAGNOSIS — Z23 Encounter for immunization: Secondary | ICD-10-CM | POA: Diagnosis not present

## 2020-05-02 DIAGNOSIS — Z9884 Bariatric surgery status: Secondary | ICD-10-CM | POA: Diagnosis not present

## 2020-05-02 DIAGNOSIS — G4733 Obstructive sleep apnea (adult) (pediatric): Secondary | ICD-10-CM | POA: Diagnosis not present

## 2020-05-02 DIAGNOSIS — Z903 Acquired absence of stomach [part of]: Secondary | ICD-10-CM | POA: Diagnosis not present

## 2020-06-04 ENCOUNTER — Encounter (HOSPITAL_COMMUNITY): Payer: Self-pay | Admitting: Emergency Medicine

## 2020-06-04 ENCOUNTER — Other Ambulatory Visit: Payer: Self-pay

## 2020-06-04 ENCOUNTER — Emergency Department (HOSPITAL_COMMUNITY)
Admission: EM | Admit: 2020-06-04 | Discharge: 2020-06-05 | Disposition: A | Discharge status: Other Acute Inpatient Hospital | Payer: Medicare Other | Source: Home / Self Care | Attending: Emergency Medicine | Admitting: Emergency Medicine

## 2020-06-04 DIAGNOSIS — I10 Essential (primary) hypertension: Secondary | ICD-10-CM | POA: Insufficient documentation

## 2020-06-04 DIAGNOSIS — Z20822 Contact with and (suspected) exposure to covid-19: Secondary | ICD-10-CM | POA: Insufficient documentation

## 2020-06-04 DIAGNOSIS — Z96653 Presence of artificial knee joint, bilateral: Secondary | ICD-10-CM | POA: Insufficient documentation

## 2020-06-04 DIAGNOSIS — F322 Major depressive disorder, single episode, severe without psychotic features: Secondary | ICD-10-CM | POA: Insufficient documentation

## 2020-06-04 DIAGNOSIS — Z7982 Long term (current) use of aspirin: Secondary | ICD-10-CM | POA: Insufficient documentation

## 2020-06-04 DIAGNOSIS — Z87891 Personal history of nicotine dependence: Secondary | ICD-10-CM | POA: Insufficient documentation

## 2020-06-04 DIAGNOSIS — R001 Bradycardia, unspecified: Secondary | ICD-10-CM | POA: Diagnosis not present

## 2020-06-04 DIAGNOSIS — T1491XA Suicide attempt, initial encounter: Secondary | ICD-10-CM

## 2020-06-04 LAB — COMPREHENSIVE METABOLIC PANEL
ALT: 22 U/L (ref 0–44)
AST: 22 U/L (ref 15–41)
Albumin: 4.2 g/dL (ref 3.5–5.0)
Alkaline Phosphatase: 105 U/L (ref 38–126)
Anion gap: 10 (ref 5–15)
BUN: 20 mg/dL (ref 6–20)
CO2: 26 mmol/L (ref 22–32)
Calcium: 8.9 mg/dL (ref 8.9–10.3)
Chloride: 101 mmol/L (ref 98–111)
Creatinine, Ser: 1 mg/dL (ref 0.61–1.24)
GFR, Estimated: >60 mL/min (ref >60)
Glucose, Bld: 105 mg/dL — ABNORMAL HIGH (ref 70–99)
Potassium: 3.7 mmol/L (ref 3.5–5.1)
Sodium: 137 mmol/L (ref 135–145)
Total Bilirubin: 1 mg/dL (ref 0.3–1.2)
Total Protein: 7.4 g/dL (ref 6.5–8.1)

## 2020-06-04 LAB — CBC WITH DIFFERENTIAL/PLATELET
Abs Immature Granulocytes: 0.02 10*3/uL (ref 0.00–0.07)
Basophils Absolute: 0 10*3/uL (ref 0.0–0.1)
Basophils Relative: 0 %
Eosinophils Absolute: 0.1 10*3/uL (ref 0.0–0.5)
Eosinophils Relative: 1 %
HCT: 45.6 % (ref 39.0–52.0)
Hemoglobin: 14.6 g/dL (ref 13.0–17.0)
Immature Granulocytes: 0 %
Lymphocytes Relative: 17 %
Lymphs Abs: 1.7 10*3/uL (ref 0.7–4.0)
MCH: 27.9 pg (ref 26.0–34.0)
MCHC: 32 g/dL (ref 30.0–36.0)
MCV: 87 fL (ref 80.0–100.0)
Monocytes Absolute: 0.7 10*3/uL (ref 0.1–1.0)
Monocytes Relative: 7 %
Neutro Abs: 7.2 10*3/uL (ref 1.7–7.7)
Neutrophils Relative %: 75 %
Platelets: 252 10*3/uL (ref 150–400)
RBC: 5.24 MIL/uL (ref 4.22–5.81)
RDW: 12.5 % (ref 11.5–15.5)
WBC: 9.7 10*3/uL (ref 4.0–10.5)
nRBC: 0 % (ref 0.0–0.2)

## 2020-06-04 LAB — RAPID URINE DRUG SCREEN, HOSP PERFORMED
Amphetamines: NOT DETECTED
Barbiturates: NOT DETECTED
Benzodiazepines: NOT DETECTED
Cocaine: NOT DETECTED
Opiates: NOT DETECTED
Tetrahydrocannabinol: NOT DETECTED

## 2020-06-04 LAB — ETHANOL: Alcohol, Ethyl (B): <10 mg/dL (ref <10)

## 2020-06-04 LAB — SALICYLATE LEVEL: Salicylate Lvl: <7 mg/dL — ABNORMAL LOW (ref 7.0–30.0)

## 2020-06-04 LAB — ACETAMINOPHEN LEVEL: Acetaminophen (Tylenol), Serum: <10 ug/mL — ABNORMAL LOW (ref 10–30)

## 2020-06-04 MED ORDER — ACETAMINOPHEN 325 MG PO TABS: 650.0000 mg | ORAL_TABLET | ORAL | Status: DC | PRN

## 2020-06-04 MED ORDER — TRAZODONE HCL 50 MG PO TABS
50.0000 mg | ORAL_TABLET | Freq: Every evening | ORAL | Status: DC | PRN
  Administered 2020-06-05: 50 mg via ORAL
  Filled 2020-06-04: qty 1

## 2020-06-04 MED ORDER — LORAZEPAM 1 MG PO TABS
2.0000 mg | ORAL_TABLET | Freq: Once | ORAL | Status: AC
  Administered 2020-06-04: 2 mg via ORAL
  Filled 2020-06-04: qty 2

## 2020-06-04 MED ORDER — LOSARTAN POTASSIUM 25 MG PO TABS: 50.0000 mg | ORAL_TABLET | Freq: Every day | ORAL | Status: DC

## 2020-06-04 MED ORDER — ONDANSETRON 4 MG PO TBDP
4.0000 mg | ORAL_TABLET | Freq: Three times a day (TID) | ORAL | Status: DC | PRN
  Filled 2020-06-04: qty 1

## 2020-06-04 MED ORDER — ALBUTEROL SULFATE HFA 108 (90 BASE) MCG/ACT IN AERS: 2.0000 | INHALATION_SPRAY | Freq: Four times a day (QID) | RESPIRATORY_TRACT | Status: DC | PRN

## 2020-06-04 MED ORDER — TAMSULOSIN HCL 0.4 MG PO CAPS: 0.4000 mg | ORAL_CAPSULE | Freq: Every day | ORAL | Status: DC

## 2020-06-04 NOTE — ED Notes (Addendum)
Pt went to family room to complete TTS.  Upon return pt was very agitated and yelling regarding "not wanting to be locked up like a prisoner."  "I was in a good mood until that woman told me I was staying, I need to get home. "  EDP notified of pt's status. Ativan ordered but pt refused when offered something for his nerves.
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
## 2020-06-04 NOTE — ED Provider Notes (Signed)
[facility] EMERGENCY DEPARTMENT Provider Note   CSN: [id number] Arrival date & time: 06/04/20  1631     History Chief Complaint  Patient presents with  . Medical Clearance    Tanner Meyers is a 59 y.o. male.  HPI 59 year old male presents in police custody after found by family to have a rope around his neck.  The patient tells me that he was making a rope swing and was testing the strength of the knot on the tree and somehow the rope slipped around his neck.  Niece found him and tried to support him.  When first responders arrived he was unresponsive briefly but then easily awoke.  The patient denies headache or neck pain.  He denies this was a suicide attempt.  He states he is due to leave for [place] tomorrow and is due to collect money tonight.  He initially got in his truck and ran from police and had to be stopped and was brought here for psychiatric evaluation.   Past Medical History:  Diagnosis Date  . Arthritis   . GERD (gastroesophageal reflux disease)   . History of kidney stones   . Hypertension   . Kidney stone   . Shortness of breath    SOB with no exertion  . Sleep apnea    supposed to wear BiPaP machine at night, but doesn't  . Umbilical hernia     There are no problems to display for this patient.   Past Surgical History:  Procedure Laterality Date  . BACK SURGERY  12/12/2002   [facility]  . COLONOSCOPY N/A 03/01/2013   WUJ:WJXBJYN and rectal polyps-removed as described above/Colonic diverticulosis  . ESOPHAGOGASTRODUODENOSCOPY (EGD) WITH ESOPHAGEAL DILATION     [facility]  . FOOT SURGERY Right 2009   [facility]  . GASTRIC BYPASS    . TOTAL KNEE ARTHROPLASTY Bilateral Rt-2012, Lt-2010   [facility]  . [place]       History reviewed. No pertinent family history.  Social History   Tobacco Use  . Smoking status: Former Smoker    Years: 17.00    Types: Cigarettes  .  Smokeless tobacco: Current User    Types: Snuff  Vaping Use  . Vaping Use: Never used  Substance Use Topics  . Alcohol use: No  . Drug use: No    Home Medications Prior to Admission medications   Medication Sig Start Date End Date Taking? Authorizing Provider  albuterol (PROVENTIL HFA;VENTOLIN HFA) 108 (90 BASE) MCG/ACT inhaler Inhale 2 puffs into the lungs every 6 (six) hours as needed for wheezing.    [provider]  aspirin 325 MG tablet Take 650 mg by mouth once.    [provider]  levofloxacin (LEVAQUIN) 500 MG tablet Take 1 tablet (500 mg total) by mouth daily. 08/08/18   Triplett, Tammy, PA-C  losartan (COZAAR) 50 MG tablet Take 50 mg by mouth daily.    [provider]  ondansetron (ZOFRAN ODT) 4 MG disintegrating tablet 34m ODT q4 hours prn nausea/vomit 09/15/18   ZMilton Ferguson MD  oxyCODONE-acetaminophen (PERCOCET/ROXICET) 5-325 MG tablet Take 1 tablet by mouth every 6 (six) hours as needed. 09/15/18   ZMilton Ferguson MD  peg 3350 powder (MOVIPREP) 100 G SOLR Take 1 kit (100 g total) by mouth as directed. Patient not taking: Reported on 08/29/2014 02/12/13   RDaneil Dolin MD  tamsulosin (FLOMAX) 0.4 MG CAPS capsule  Take 1 capsule (0.4 mg total) by mouth daily. 09/15/18   Milton Ferguson, MD  traZODone (DESYREL) 50 MG tablet Take 50 mg by mouth at bedtime as needed for sleep.     [provider]    Allergies    Patient has no known allergies.  Review of Systems   Review of Systems  HENT: Negative for sore throat and voice change.   Respiratory: Negative for shortness of breath.   Musculoskeletal: Negative for neck pain.  Neurological: Positive for syncope. Negative for headaches.  All other systems reviewed and are negative.   Physical Exam Updated Vital Signs BP (!) 146/81 (BP Location: Left Arm)   Pulse (!) 54   Temp 98.8 F (37.1 C) (Oral)   Resp 18   Ht 5' 7.5" (1.715 m)   Wt 99.8 kg   SpO2 98%   BMI 33.95 kg/m    Physical Exam Vitals and nursing note reviewed.  Constitutional:      Appearance: He is well-developed.  HENT:     Head: Normocephalic and atraumatic.     Right Ear: External ear normal.     Left Ear: External ear normal.     Nose: Nose normal.  Eyes:     General:        Right eye: No discharge.        Left eye: No discharge.  Neck:     Comments: No neck tenderness or swelling. There is mild redness to the anterior neck, presumably from the rope. No crepitus or voice change Cardiovascular:     Rate and Rhythm: Normal rate and regular rhythm.     Heart sounds: Normal heart sounds.  Pulmonary:     Effort: Pulmonary effort is normal.     Breath sounds: Normal breath sounds.  Abdominal:     Palpations: Abdomen is soft.     Tenderness: There is no abdominal tenderness.  Musculoskeletal:     Cervical back: Neck supple.  Skin:    General: Skin is warm and dry.  Neurological:     Mental Status: He is alert.     Comments: Awake, alert, no slurred speech or facial droop. 5/5 strength in all 4 extremities. Normal gait   Psychiatric:        Mood and Affect: Mood is not anxious.     ED Results / Procedures / Treatments   Labs (all labs ordered are listed, but only abnormal results are displayed) Labs Reviewed  COMPREHENSIVE METABOLIC PANEL - Abnormal; Notable for the following components:      Result Value   Glucose, Bld 105 (*)    All other components within normal limits  ACETAMINOPHEN LEVEL - Abnormal; Notable for the following components:   Acetaminophen (Tylenol), Serum <10 (*)    All other components within normal limits  SALICYLATE LEVEL - Abnormal; Notable for the following components:   Salicylate Lvl <4.2 (*)    All other components within normal limits  RESPIRATORY PANEL BY RT PCR (FLU A&B, COVID)  ETHANOL  RAPID URINE DRUG SCREEN, HOSP PERFORMED  CBC WITH DIFFERENTIAL/PLATELET    EKG EKG Interpretation  Date/Time:  Sunday June 04 2020 18:01:32  EDT Ventricular Rate:  48 PR Interval:  160 QRS Duration: 76 QT Interval:  426 QTC Calculation: 380 R Axis:   12 Text Interpretation: Sinus bradycardia with sinus arrhythmia Nonspecific ST abnormality Abnormal ECG Confirmed by Sherwood Gambler [phone]) on 06/04/2020 7:20:22 PM   Radiology No results found.  Procedures Procedures (including  critical care time)  Medications Ordered in ED Medications  albuterol (VENTOLIN HFA) 108 (90 Base) MCG/ACT inhaler 2 puff (has no administration in time range)  losartan (COZAAR) tablet 50 mg (has no administration in time range)  ondansetron (ZOFRAN-ODT) disintegrating tablet 4 mg (has no administration in time range)  tamsulosin (FLOMAX) capsule 0.4 mg (has no administration in time range)  traZODone (DESYREL) tablet 50 mg (has no administration in time range)  acetaminophen (TYLENOL) tablet 650 mg (has no administration in time range)  LORazepam (ATIVAN) tablet 2 mg (2 mg Oral Given 06/04/20 2335)    ED Course  I have reviewed the triage vital signs and the nursing notes.  Pertinent labs & imaging results that were available during my care of the patient were reviewed by me and considered in my medical decision making (see chart for details).    MDM Rules/Calculators/A&P                          From a medical perspective, he has some mild redness to his anterior neck but no swelling, crepitus, and my suspicion of serious neck injury or anoxic brain injury is very low.  He is speaking clearly and without difficulty.  There is a question of whether or not he was unresponsive but he also awoke as soon his first responders came.  I doubt serious injury.  From a psychiatric perspective, the patient tells me that this was not intentional but a lot of things had to have coincidentally happened for this to have occurred.  He states that the rope just happened to slip around his neck but it seemed like the knee seem to indicate that he put it on his  neck.  This is highly concerning for suicide attempt.  I have involuntarily committed him and TTS has seen and recommends inpatient.  The patient has been placed in psychiatric observation due to the need to provide a safe environment for the patient while obtaining psychiatric consultation and evaluation, as well as ongoing medical and medication management to treat the patient's condition.  The patient has been placed under full IVC at this time.  Final Clinical Impression(s) / ED Diagnoses Final diagnoses:  None    Rx / DC Orders ED Discharge Orders    None       Sherwood Gambler, MD 06/04/20 2339

## 2020-06-04 NOTE — ED Triage Notes (Signed)
Patient brought in via police custody for psych evaluation. Per police niece stated patient called all the family members today which is not a normal for him. Niece went to check on him and he seemed fine while she was there talking to him but as she was leaving she saw patient standing in chair with rope hanging from tree and patient putting rope around his neck. Niece went to help. Patient states "I was just playing around but something hit me from behind in the head and I fell with the rope around my neck. I was just trying to hang a swing for nephew" Patient denies any SI or HI. Patient had slight red legatory mark on neck and patient was unconscious upon police arrival but was easily aroused. Patient then got up and took off in truck and police had to pursue him. Patient does report 'a large amount of life stressors recently but does not elaborate in triage.

## 2020-06-05 ENCOUNTER — Inpatient Hospital Stay (HOSPITAL_COMMUNITY)
Admission: AD | Admit: 2020-06-05 | Discharge: 2020-06-08 | DRG: 881 | Disposition: A | Discharge status: Home / Self Care | Payer: Medicare Other | Attending: Psychiatry | Admitting: Psychiatry

## 2020-06-05 DIAGNOSIS — F329 Major depressive disorder, single episode, unspecified: Principal | ICD-10-CM | POA: Diagnosis present

## 2020-06-05 DIAGNOSIS — Z20822 Contact with and (suspected) exposure to covid-19: Secondary | ICD-10-CM | POA: Diagnosis present

## 2020-06-05 DIAGNOSIS — R451 Restlessness and agitation: Secondary | ICD-10-CM | POA: Diagnosis not present

## 2020-06-05 DIAGNOSIS — F17220 Nicotine dependence, chewing tobacco, uncomplicated: Secondary | ICD-10-CM | POA: Diagnosis present

## 2020-06-05 DIAGNOSIS — M1990 Unspecified osteoarthritis, unspecified site: Secondary | ICD-10-CM | POA: Diagnosis present

## 2020-06-05 DIAGNOSIS — Z87442 Personal history of urinary calculi: Secondary | ICD-10-CM

## 2020-06-05 DIAGNOSIS — Z7982 Long term (current) use of aspirin: Secondary | ICD-10-CM | POA: Diagnosis not present

## 2020-06-05 DIAGNOSIS — Z79899 Other long term (current) drug therapy: Secondary | ICD-10-CM | POA: Diagnosis not present

## 2020-06-05 DIAGNOSIS — Z86010 Personal history of colonic polyps: Secondary | ICD-10-CM

## 2020-06-05 DIAGNOSIS — R480 Dyslexia and alexia: Secondary | ICD-10-CM | POA: Diagnosis present

## 2020-06-05 DIAGNOSIS — T1491XA Suicide attempt, initial encounter: Secondary | ICD-10-CM

## 2020-06-05 DIAGNOSIS — Z9884 Bariatric surgery status: Secondary | ICD-10-CM | POA: Diagnosis not present

## 2020-06-05 DIAGNOSIS — R45851 Suicidal ideations: Secondary | ICD-10-CM | POA: Diagnosis present

## 2020-06-05 DIAGNOSIS — G4730 Sleep apnea, unspecified: Secondary | ICD-10-CM | POA: Diagnosis present

## 2020-06-05 DIAGNOSIS — K219 Gastro-esophageal reflux disease without esophagitis: Secondary | ICD-10-CM | POA: Diagnosis present

## 2020-06-05 DIAGNOSIS — Z96653 Presence of artificial knee joint, bilateral: Secondary | ICD-10-CM | POA: Diagnosis present

## 2020-06-05 DIAGNOSIS — R001 Bradycardia, unspecified: Secondary | ICD-10-CM | POA: Diagnosis not present

## 2020-06-05 DIAGNOSIS — I10 Essential (primary) hypertension: Secondary | ICD-10-CM | POA: Diagnosis present

## 2020-06-05 HISTORY — DX: Dyslexia and alexia: R48.0

## 2020-06-05 HISTORY — DX: Calculus of kidney: N20.0

## 2020-06-05 LAB — RESPIRATORY PANEL BY RT PCR (FLU A&B, COVID)
Influenza A by PCR: NEGATIVE
Influenza B by PCR: NEGATIVE
SARS Coronavirus 2 by RT PCR: NEGATIVE

## 2020-06-05 MED ORDER — ASPIRIN 325 MG PO TABS
650.0000 mg | ORAL_TABLET | Freq: Once | ORAL | Status: DC
  Filled 2020-06-05: qty 2

## 2020-06-05 MED ORDER — TRAZODONE HCL 50 MG PO TABS
50.0000 mg | ORAL_TABLET | Freq: Every evening | ORAL | Status: DC | PRN
  Administered 2020-06-05 – 2020-06-07 (×3): 50 mg via ORAL
  Filled 2020-06-05 (×3): qty 1

## 2020-06-05 MED ORDER — ONDANSETRON 4 MG PO TBDP: 4.0000 mg | ORAL_TABLET | Freq: Three times a day (TID) | ORAL | Status: DC | PRN

## 2020-06-05 MED ORDER — THIAMINE HCL 100 MG PO TABS
100.0000 mg | ORAL_TABLET | Freq: Every day | ORAL | Status: DC
  Administered 2020-06-05: 100 mg via ORAL
  Filled 2020-06-05: qty 1

## 2020-06-05 MED ORDER — VITAMIN D3 25 MCG PO TABS
2000.0000 [IU] | ORAL_TABLET | Freq: Every day | ORAL | Status: DC
  Administered 2020-06-06 – 2020-06-08 (×3): 2000 [IU] via ORAL
  Filled 2020-06-05 (×5): qty 2

## 2020-06-05 MED ORDER — VITAMIN D 25 MCG (1000 UNIT) PO TABS
2000.0000 [IU] | ORAL_TABLET | Freq: Every day | ORAL | Status: DC
  Administered 2020-06-05: 2000 [IU] via ORAL
  Filled 2020-06-05: qty 2

## 2020-06-05 MED ORDER — LOSARTAN POTASSIUM 50 MG PO TABS
50.0000 mg | ORAL_TABLET | Freq: Every day | ORAL | Status: DC
  Administered 2020-06-06 – 2020-06-08 (×3): 50 mg via ORAL
  Filled 2020-06-05 (×5): qty 1

## 2020-06-05 MED ORDER — THIAMINE HCL 100 MG PO TABS
100.0000 mg | ORAL_TABLET | Freq: Every day | ORAL | Status: DC
  Administered 2020-06-06 – 2020-06-08 (×3): 100 mg via ORAL
  Filled 2020-06-05 (×5): qty 1

## 2020-06-05 MED ORDER — VITAMIN B-12 1000 MCG PO TABS
500.0000 ug | ORAL_TABLET | Freq: Every day | ORAL | Status: DC
  Administered 2020-06-05: 500 ug via ORAL
  Filled 2020-06-05: qty 1

## 2020-06-05 MED ORDER — ALBUTEROL SULFATE HFA 108 (90 BASE) MCG/ACT IN AERS: 2.0000 | INHALATION_SPRAY | Freq: Four times a day (QID) | RESPIRATORY_TRACT | Status: DC | PRN

## 2020-06-05 MED ORDER — TAMSULOSIN HCL 0.4 MG PO CAPS
0.4000 mg | ORAL_CAPSULE | Freq: Every day | ORAL | Status: DC
  Administered 2020-06-06 – 2020-06-08 (×3): 0.4 mg via ORAL
  Filled 2020-06-05 (×5): qty 1

## 2020-06-05 MED ORDER — CYANOCOBALAMIN 500 MCG PO TABS
500.0000 ug | ORAL_TABLET | Freq: Every day | ORAL | Status: DC
  Administered 2020-06-06 – 2020-06-08 (×3): 500 ug via ORAL
  Filled 2020-06-05 (×5): qty 1

## 2020-06-05 NOTE — ED Notes (Addendum)
Attempted report x1. Left name and number with receiving nurse.

## 2020-06-05 NOTE — Progress Notes (Signed)
Pt accepted to [facility], bed 300-2  Tanner Evert, NP is the accepting provider.    Dr. Mallie Darting  is the attending provider.    Call report to [phone]    Katie @ [facility] notified.     Pt is under IVC and will be transported by law enforcement  Pt is scheduled to arrive at [facility] [place], pending a negative Covid test.    Audree Camel, MSW, LCSW, [place] Worker II Disposition CSW [phone]

## 2020-06-05 NOTE — ED Notes (Signed)
[facility] states pt. Has a bed at 10 pm tonight

## 2020-06-05 NOTE — ED Notes (Signed)
Pt is very adamant that he is not suicidal, nor was he yesterday when he was found hanging by a rope in a tree by family.

## 2020-06-06 ENCOUNTER — Encounter (HOSPITAL_COMMUNITY): Payer: Self-pay | Admitting: Psychiatry

## 2020-06-06 ENCOUNTER — Other Ambulatory Visit: Payer: Self-pay

## 2020-06-06 DIAGNOSIS — T1491XA Suicide attempt, initial encounter: Secondary | ICD-10-CM

## 2020-06-06 LAB — LIPID PANEL
Cholesterol: 153 mg/dL (ref 0–200)
HDL: 56 mg/dL (ref >40)
LDL Cholesterol: 85 mg/dL (ref 0–99)
Total CHOL/HDL Ratio: 2.7 RATIO
Triglycerides: 59 mg/dL (ref <150)
VLDL: 12 mg/dL (ref 0–40)

## 2020-06-06 LAB — TSH: TSH: 2.458 u[IU]/mL (ref 0.350–4.500)

## 2020-06-06 MED ORDER — LORAZEPAM 1 MG PO TABS
1.0000 mg | ORAL_TABLET | Freq: Every evening | ORAL | Status: DC | PRN
  Administered 2020-06-06 – 2020-06-07 (×2): 1 mg via ORAL
  Filled 2020-06-06 (×2): qty 1

## 2020-06-06 MED ORDER — ZIPRASIDONE MESYLATE 20 MG IM SOLR: 20.0000 mg | INTRAMUSCULAR | Status: DC | PRN

## 2020-06-06 MED ORDER — OLANZAPINE 5 MG PO TBDP: 5.0000 mg | ORAL_TABLET | Freq: Three times a day (TID) | ORAL | Status: DC | PRN

## 2020-06-06 MED ORDER — LORAZEPAM 1 MG PO TABS: 1.0000 mg | ORAL_TABLET | ORAL | Status: DC | PRN

## 2020-06-06 NOTE — Progress Notes (Signed)
Nutrition Brief Note  Patient identified on the Malnutrition Screening Tool (MST) Report  Pt's weight loss a result of gastric bypass surgery.   Wt Readings from Last 15 Encounters:  06/05/20 103 kg  06/04/20 99.8 kg  08/24/18 108.9 kg  08/08/18 108.9 kg  08/29/14 (!) 153.3 kg  05/06/13 (!) 147 kg  03/01/13 (!) 145.6 kg    Body mass index is 35.03 kg/m. Patient meets criteria for obesity based on current BMI.    Labs and medications reviewed.   No nutrition interventions warranted at this time. If nutrition issues arise, please consult RD.   Clayton Bibles, MS, RD, LDN Inpatient Clinical Dietitian Contact information available via Amion

## 2020-06-06 NOTE — Plan of Care (Signed)
Nurse discussed depression and coping skills with patient.

## 2020-06-06 NOTE — Progress Notes (Signed)
D:  Patient's self inventory sheet, patient has poor sleep, no sleep medication.  Good appetite, normal energy level, good concentration.  Denied depression, hopeless and anxiety.  Denied withdrawals.  Denied SI.  Denied physical problems.  Denied physical pain.  Goal is discharge.  Plans to do whatever it takes.  No discharge plans. A:  Medications administered per MD orders.  Emotional support and encouragement given patient. R:  Denied SI and HI, contracts for safety.  Denied A/V hallucinations.  Safety maintained with 15 minute checks.

## 2020-06-06 NOTE — Progress Notes (Signed)
Recreation Therapy Notes  Animal-Assisted Activity (AAA) Program Checklist/Progress Notes Patient Eligibility Criteria Checklist & Daily Group note for Rec Tx Intervention  Date: 10.19.21 Time: 69 Location: [address]   AAA/T Program Assumption of Risk Form signed by Teacher, music or Parent Legal Guardian  YES  Patient is free of allergies or sever asthma  YES  Patient reports no fear of animals  YES  Patient reports no history of cruelty to animals YES  Patient understands his/her participation is voluntary  YES  Patient washes hands before animal contact  YES   Patient washes hands after animal contact  YES   Behavioral Response: Engaged  Education: Contractor, Appropriate Animal Interaction   Education Outcome: Acknowledges understanding/In group clarification offered/Needs additional education.  Clinical Observations/Feedback: Pt attended and participated in activity.    Victorino Sparrow, LRT/CTRS    Victorino Sparrow A 06/06/2020 3:23 PM

## 2020-06-06 NOTE — Tx Team (Signed)
Initial Treatment Plan 06/05/2020 2315 PM Tanner Meyers JFT:[id number]  PATIENT STRESSORS: Marital or family conflict Medication change or noncompliance   PATIENT STRENGTHS: Active sense of humor Capable of independent living Supportive family/friends   PATIENT IDENTIFIED PROBLEMS: Alleged attempt to hang himself per his brother and niece, which pt denies was a suicide attempt  Separation from wife that occurred last week, pt said she has "changed" and has "no sense"                   DISCHARGE CRITERIA:  Ability to meet basic life and health needs Improved stabilization in mood, thinking, and/or behavior Medical problems require only outpatient monitoring Need for constant or close observation no longer present Verbal commitment to aftercare and medication compliance  PRELIMINARY DISCHARGE PLAN: Outpatient therapy Return to previous living arrangement Return to previous work or school arrangements  PATIENT/FAMILY INVOLVEMENT: This treatment plan has been presented to and reviewed with the patient, Tanner Meyers, and/or family member.  The patient and family have been given the opportunity to ask questions and make suggestions.  Harlow Asa, RN 06/05/2020, 7915 PM
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
## 2020-06-06 NOTE — [facility] Suicide Risk Assessment (Signed)
[facility] Ad[facility] Suicide Risk Assessment   Nursing information obtained from:  Patient Demographic factors:  Male, Caucasian, Low socioeconomic status, Living alone, Unemployed Current Mental Status:  Suicidal ideation indicated by others Loss Factors:  Loss of significant relationship Historical Factors:  NA Risk Reduction Factors:  Sense of responsibility to family, Positive social support  Total Time spent with patient: 20 minutes Principal Problem: Suicide attempt [facility]) Diagnosis:  Principal Problem:   Suicide attempt [facility]) Active Problems:   MDD (major depressive disorder)  Subjective Data:   Tanner Meyers is a 59 year old male with no reported psychiatric history, who presented to [facility] after police were called by his family. He had texted family members telling them that he loved them. His niece and brother reportedly then saw him standing on a chair in his yard with a rope around his neck. Police were called, and the patient attempted to leave in his truck when police arrived. On assessment this morning, patient is quite agitated about being in the hospital and repeatedly requesting discharge. He admits that his wife left him last week but denies suicidal ideation. He claims that he was preparing to hang a tire swing in his yard, and was pulling on the rope to make sure it was stable, but accidentally wrapped it around his neck. He states that he left the home when a police officer arrived who had a history of negative interactions with his ex-wife. He is focused on discharging because he recently sold his home in [place] and is supposed to buy a new home in [place] tomorrow. He states that he wants to move to [place] to "start a new life" after his father's recent death and the end of his marriage. He states that he texted his family members that he loved them because he was moving to [place]. He minimizes recent separation, saying that his wife was an alcohol and drug addict, and that life will be better  without her. He states he does not have contact information for his brother and niece who called the police but does provide another brother's phone number for collateral informationHerbie Meyers [phone]. He does admit to sadness related to his father's death in January 25, 2020 but otherwise denies all symptoms of depression. He denies any SI/HI/AVH. Denies drug/alcohol use. UDS, BAL negative.     Continued Clinical Symptoms:  Alcohol Use Disorder Identification Test Final Score (AUDIT): 0 The "Alcohol Use Disorders Identification Test", Guidelines for Use in Primary Care, Second Edition.  World Pharmacologist [facility]). Score between 0-7:  no or low risk or alcohol related problems. Score between 8-15:  moderate risk of alcohol related problems. Score between 16-19:  high risk of alcohol related problems. Score 20 or above:  warrants further diagnostic evaluation for alcohol dependence and treatment.   CLINICAL FACTORS:   Previous Psychiatric Diagnoses and Treatments Medical Diagnoses and Treatments/Surgeries   Musculoskeletal:  Strength & Muscle Tone: within normal limits Gait & Station: normal Patient leans: N/A  Psychiatric Specialty Exam: Physical Exam  Review of Systems  Blood pressure (!) 152/80, pulse (!) 47, temperature 97.7 F (36.5 C), temperature source Oral, resp. rate 18, height 5' 7.5" (1.715 m), weight 103 kg, SpO2 100 %.Body mass index is 35.03 kg/m.  General Appearance: Disheveled and dressed in hospital scrubs  Eye Contact:  Good  Speech:  Clear and Coherent, Normal Rate and rapid at times, irritable tone of voice  Volume:  Normal  Mood:  "great"  Affect:  Constricted and anxious  Thought  Process:  Coherent, Goal Directed and Linear  Orientation:  Full (Time, Place, and Person)  Thought Content:  WDL and Logical  Suicidal Thoughts:  denied  Homicidal Thoughts:  denied  Memory:  Immediate;   Fair Recent;   Fair Remote;   Fair  Judgement:  Other:  limited   Insight:  Lacking  Psychomotor Activity:  Normal  Concentration:  Concentration: Fair and Attention Span: Fair  Recall:  Good  Fund of Knowledge:  Good  Language:  Good  Akathisia:  No  Handed:  Right  AIMS (if indicated):     Assets:  Communication Skills Desire for Improvement Resilience  ADL's:  Intact  Cognition:  WNL  Sleep:  Number of Hours: 4.25      COGNITIVE FEATURES THAT CONTRIBUTE TO RISK:  Polarized thinking    SUICIDE RISK:   Moderate:  Frequent suicidal ideation with limited intensity, and duration, some specificity in terms of plans, no associated intent, good self-control, limited dysphoria/symptomatology, some risk factors present, and identifiable protective factors, including available and accessible social support.  PLAN OF CARE:  59 yo male with no reported past psychiatric hx who presented to the ER after reported suicide attempt via hanging. Throughout hospital stay patient has adamantly denied and said he was hanging a tire swing, on my interview he states that he was "playing a joke" on his brother and that I would be the first person he would "tell the truth" to. Collateral obtained from brother who called the police reveals that there is elevated concern about patient's behavior and SA where he had attempted to hang himself and that patient reportedly called him requesting him to provide a different story. Patient minimizes actions and is labile on interview when informed that he will not be discharged today.     I certify that inpatient services furnished can reasonably be expected to improve the patient's condition.   Ival Bible, MD 06/06/2020, 3:45 PM

## 2020-06-06 NOTE — Progress Notes (Signed)
Pt is an IVC'd 59 y.o. male presenting to [facility] from [facility] ED. Pt reports that there had been a rope hanging from a tree in his yard for a month or so now that he was trying to fix as a tire swing for his nephew. He said he was standing on top of a cooler and was pulling on the loop of rope to test its strength. He was trying to ensure it would bear weight. Pt said that that it wasn't strong enough and the limb above broke and the rope came around his chin. He said that his brother and niece drove by and thought he was trying to hang himself. His niece had ran to him and tried to grab him, which caused him to fall and the rope to hang around his neck. Pt denies that he has ever felt suicidal or that this was a suicide attempt. Pt denies that he has ever been on any psychiatric medications or seen a psychiatrist or therapist. He denies any drug use or alcohol use recently (last drink several months ago). He uses dipping tobacco every day, 1 can in 1 to 1.5 days. Reports previous fall on Sunday where he tripped over a hose but didn't obtain any injuries. Placed on high fall risk prevention protocol. Denies any past or present physical, verbal, or sexual abuse. He does report feeling depressed sometimes, but doesn't identify any current stressors. He does mention that he separated from his wife last week because she is seeing somebody else that is younger then him. He said that his wife has "changed" and has "no sense." But denies that this is related to this incident. Identifies best friend as his support and that he is living alone. He is on disability and focused on getting discharged. Pt said that he was supposed to be getting his house sold because he will be moving to [place] soon. Pt said that he will be getting a cabin down there.   Pt denies SI/HI and AVH at the time of admission. Verbally contracts for safety, agrees to notify staff immediately for any thoughts of harming himself or anyone else. Unit  rules/policies discussed. Pts bill of rights provided. Consents discussed and signed. Belongings allowed on the unit and contraband discussed with pt. Belongings not allowed on the unit secured in assigned locker. Skin assessment completed. Pt currently has an abdominal hernia which he has a hx of. Food/fluids offered and accepted. Unit tour provided. Active listening, reassurance, and support provided. Q 15 min safety checks initiated. Pt's safety has been maintained.

## 2020-06-06 NOTE — Progress Notes (Signed)
   06/06/20 2050  COVID-19 Daily Checkoff  Have you had a fever (temp > 37.80C/100F)  in the past 24 hours?  No  COVID-19 EXPOSURE  Have you traveled outside the state in the past 14 days? No  Have you been in contact with someone with a confirmed diagnosis of COVID-19 or PUI in the past 14 days without wearing appropriate PPE? No  Have you been living in the same home as a person with confirmed diagnosis of COVID-19 or a PUI (household contact)? No  Have you been diagnosed with COVID-19? No

## 2020-06-06 NOTE — H&P (Signed)
Psychiatric Admission Assessment Adult  Patient Identification: Tanner Meyers MRN:  [id number] Date of Evaluation:  06/06/2020 Chief Complaint:  MDD (major depressive disorder) [F32.9] Principal Diagnosis: <principal problem not specified> Diagnosis:  Active Problems:   MDD (major depressive disorder)  History of Present Illness: Tanner Meyers is a 59 year old male with no reported psychiatric history, who presented to [facility] after police were called by his family. He had texted family members telling them that he loved them. His niece and brother reportedly then saw him standing on a chair in his yard with a rope around his neck. Police were called, and the patient attempted to leave in his truck when police arrived. On assessment this morning, patient is quite agitated about being in the hospital and repeatedly requesting discharge. He admits that his wife left him last week but denies suicidal ideation. He claims that he was preparing to hang a tire swing in his yard, and was pulling on the rope to make sure it was stable, but accidentally wrapped it around his neck. He states that he left the home when a police officer arrived who had a history of negative interactions with his ex-wife. He is focused on discharging because he recently sold his home in [place] and is supposed to buy a new home in [place] tomorrow. He states that he wants to move to [place] to "start a new life" after his father's recent death and the end of his marriage. He states that he texted his family members that he loved them because he was moving to [place]. He minimizes recent separation, saying that his wife was an alcohol and drug addict, and that life will be better without her. He states he does not have contact information for his brother and niece who called the police but does provide another brother's phone number for collateral informationHerbie Meyers [phone]. He does admit to sadness related to his father's death in 01/18/20 but  otherwise denies all symptoms of depression. He denies any SI/HI/AVH. Denies drug/alcohol use. UDS, BAL negative.   Associated Signs/Symptoms: Depression Symptoms:  suicidal attempt. Denies recent symptoms of depression Duration of Depression Symptoms: No data recorded (Hypo) Manic Symptoms:  Impulsivity, Irritable Mood, Labiality of Mood, Anxiety Symptoms:  denies Psychotic Symptoms:  denies Duration of Psychotic Symptoms: No data recorded PTSD Symptoms: NA Total Time spent with patient: 30 minutes  Past Psychiatric History: Denies any prior psychiatric history. He admits to history of alcohol abuse in his 59s but states he stopped drinking at age 39. Denies prior suicide attempts or hospitalizations. He states he was evaluated by a psychiatrist while in prison in his 73s for larceny, but denies a prior diagnosis or medications.  Is the patient at risk to self? Yes.    Has the patient been a risk to self in the past 6 months? No.  Has the patient been a risk to self within the distant past? No.  Is the patient a risk to others? No.  Has the patient been a risk to others in the past 6 months? No.  Has the patient been a risk to others within the distant past? No.   Prior Inpatient Therapy:   Prior Outpatient Therapy:    Alcohol Screening: 1. How often do you have a drink containing alcohol?: Never 2. How many drinks containing alcohol do you have on a typical day when you are drinking?: 1 or 2 3. How often do you have six or more drinks on one occasion?: Never  AUDIT-C Score: 0 4. How often during the last year have you found that you were not able to stop drinking once you had started?: Never 5. How often during the last year have you failed to do what was normally expected from you because of drinking?: Never 6. How often during the last year have you needed a first drink in the morning to get yourself going after a heavy drinking session?: Never 7. How often during the last year  have you had a feeling of guilt of remorse after drinking?: Never 8. How often during the last year have you been unable to remember what happened the night before because you had been drinking?: Never 9. Have you or someone else been injured as a result of your drinking?: No 10. Has a relative or friend or a doctor or another health worker been concerned about your drinking or suggested you cut down?: No Alcohol Use Disorder Identification Test Final Score (AUDIT): 0 Substance Abuse History in the last 12 months:  No. Consequences of Substance Abuse: NA Previous Psychotropic Medications: No  Psychological Evaluations: No  Past Medical History:  Past Medical History:  Diagnosis Date  . Arthritis   . Dyslexia   . GERD (gastroesophageal reflux disease)   . History of kidney stones   . Hypertension   . Kidney stones   . Shortness of breath    SOB with no exertion  . Sleep apnea    supposed to wear BiPaP machine at night, but doesn't  . Umbilical hernia     Past Surgical History:  Procedure Laterality Date  . BACK SURGERY  12/12/2002   [facility]  . COLONOSCOPY N/A 03/01/2013   DQQ:IWLNLGX and rectal polyps-removed as described above/Colonic diverticulosis  . ESOPHAGOGASTRODUODENOSCOPY (EGD) WITH ESOPHAGEAL DILATION     [facility]  . FOOT SURGERY Right 2009   [facility]  . GASTRIC BYPASS    . TOTAL KNEE ARTHROPLASTY Bilateral Rt-2012, Lt-2010   [facility]  . Bode   Family History: History reviewed. No pertinent family history. Family Psychiatric  History: Father with alcohol use disorder. Tobacco Screening:   Social History:  Social History   Substance and Sexual Activity  Alcohol Use No   Comment: last drink several months ago     Social History   Substance and Sexual Activity  Drug Use No    Additional Social History: Marital status: Separated Separated, when?: "A couple weeks" What types  of issues is patient dealing with in the relationship?: "She is 58 years old and wanted someone closer to her age" Additional relationship information: Pt reports that he was married for "a few months" and that she is now dating a cousin of his Are you sexually active?: Yes What is your sexual orientation?: Heterosexual Has your sexual activity been affected by drugs, alcohol, medication, or emotional stress?: No Does patient have children?: Yes How many children?: 3 How is patient's relationship with their children?: "I get along great with them and I am trying to find one of them because he is in jail and when I get out I can go see him"                         Allergies:  No Known Allergies Lab Results:  Results for orders placed or performed during the hospital encounter of 06/05/20 (from the past 48 hour(s))  Lipid panel     Status: None   Collection Time: 06/06/20  6:29 AM  Result Value Ref Range   Cholesterol 153 0 - 200 mg/dL   Triglycerides 59 <150 mg/dL   HDL 56 >40 mg/dL   Total CHOL/HDL Ratio 2.7 RATIO   VLDL 12 0 - 40 mg/dL   LDL Cholesterol 85 0 - 99 mg/dL    Comment:        Total Cholesterol/HDL:CHD Risk Coronary Heart Disease Risk Table                     Men   Women  1/2 Average Risk   3.4   3.3  Average Risk       5.0   4.4  2 X Average Risk   9.6   7.1  3 X Average Risk  23.4   11.0        Use the calculated Patient Ratio above and the CHD Risk Table to determine the patient's CHD Risk.        ATP III CLASSIFICATION (LDL):  <100     mg/dL   Optimal  100-129  mg/dL   Near or Above                    Optimal  130-159  mg/dL   Borderline  160-189  mg/dL   High  >190     mg/dL   Very High Performed at [facility] [address] [place], [place] [address]   TSH     Status: None   Collection Time: 06/06/20  6:29 AM  Result Value Ref Range   TSH 2.458 0.350 - 4.500 uIU/mL    Comment: Performed by a 3rd Generation assay with  a functional sensitivity of <=0.01 uIU/mL. Performed at [facility], [place] [address] [place], [place] [address]     Blood Alcohol level:  Lab Results  Component Value Date   ETH <10 88/28/0034    Metabolic Disorder Labs:  No results found for: HGBA1C, MPG No results found for: PROLACTIN Lab Results  Component Value Date   CHOL 153 06/06/2020   TRIG 59 06/06/2020   HDL 56 06/06/2020   CHOLHDL 2.7 06/06/2020   VLDL 12 06/06/2020   [place] 85 06/06/2020    Current Medications: Current Facility-Administered Medications  Medication Dose Route Frequency Provider Last Rate Last Admin  . albuterol (VENTOLIN HFA) 108 (90 Base) MCG/ACT inhaler 2 puff  2 puff Inhalation Q6H PRN Mordecai Maes, NP      . losartan (COZAAR) tablet 50 mg  50 mg Oral Daily Mordecai Maes, NP   50 mg at 06/06/20 0748  . ondansetron (ZOFRAN-ODT) disintegrating tablet 4 mg  4 mg Oral Q8H PRN Mordecai Maes, NP      . tamsulosin [facility]) capsule 0.4 mg  0.4 mg Oral Daily Mordecai Maes, NP   0.4 mg at 06/06/20 0748  . thiamine tablet 100 mg  100 mg Oral Daily Mordecai Maes, NP   100 mg at 06/06/20 0749  . traZODone (DESYREL) tablet 50 mg  50 mg Oral QHS PRN Mordecai Maes, NP   50 mg at 06/05/20 2343  . vitamin B-12 (CYANOCOBALAMIN) tablet 500 mcg  500 mcg Oral Daily Mordecai Maes, NP   500 mcg at 06/06/20 0749  . Vitamin D3 (Vitamin D) tablet 2,000 Units  2,000 Units Oral Daily Mordecai Maes, NP   2,000 Units at 06/06/20 0749   PTA  Medications: Medications Prior to Admission  Medication Sig Dispense Refill Last Dose  . albuterol (PROVENTIL HFA;VENTOLIN HFA) 108 (90 BASE) MCG/ACT inhaler Inhale 2 puffs into the lungs every 6 (six) hours as needed for wheezing. (Patient not taking: Reported on 06/05/2020)     . aspirin 325 MG tablet Take 650 mg by mouth once. (Patient not taking: Reported on 06/05/2020)     . Cholecalciferol 50 MCG (2000 UT) TABS Take 2,000 Units by mouth  daily.     . ergocalciferol (VITAMIN D2) 1.25 MG (50000 UT) capsule Take 50,000 Units by mouth once a week.     Marland Kitchen levofloxacin (LEVAQUIN) 500 MG tablet Take 1 tablet (500 mg total) by mouth daily. (Patient not taking: Reported on 06/05/2020) 7 tablet 0   . LORazepam (ATIVAN) 1 MG tablet Take 1 mg by mouth daily.     Marland Kitchen losartan (COZAAR) 50 MG tablet Take 50 mg by mouth daily. (Patient not taking: Reported on 06/05/2020)     . ondansetron (ZOFRAN ODT) 4 MG disintegrating tablet 4mg  ODT q4 hours prn nausea/vomit (Patient not taking: Reported on 06/05/2020) 12 tablet 0   . oxyCODONE-acetaminophen (PERCOCET/ROXICET) 5-325 MG tablet Take 1 tablet by mouth every 6 (six) hours as needed. (Patient not taking: Reported on 06/05/2020) 20 tablet 0   . peg 3350 powder (MOVIPREP) 100 G SOLR Take 1 kit (100 g total) by mouth as directed. (Patient not taking: Reported on 08/29/2014) 1 kit 0   . sildenafil (REVATIO) 20 MG tablet Take 20 mg by mouth as needed.     . tamsulosin (FLOMAX) 0.4 MG CAPS capsule Take 1 capsule (0.4 mg total) by mouth daily. (Patient not taking: Reported on 06/05/2020) 5 capsule 0   . thiamine 100 MG tablet Take 100 mg by mouth in the morning and at bedtime.      . traZODone (DESYREL) 50 MG tablet Take 50 mg by mouth at bedtime as needed for sleep.  (Patient not taking: Reported on 06/05/2020)     . vitamin B-12 (CYANOCOBALAMIN) 500 MCG tablet Take 500 mcg by mouth daily.       Musculoskeletal: Strength & Muscle Tone: within normal limits Gait & Station: normal Patient leans: N/A  Psychiatric Specialty Exam: Physical Exam Vitals and nursing note reviewed.  Constitutional:      Appearance: He is well-developed.  Pulmonary:     Effort: Pulmonary effort is normal.  Musculoskeletal:        General: Normal range of motion.  Neurological:     Mental Status: He is alert and oriented to person, place, and time.     Review of Systems  Constitutional: Negative.   Respiratory: Negative  for cough and shortness of breath.   Psychiatric/Behavioral: Positive for agitation. Negative for behavioral problems, confusion, decreased concentration, dysphoric mood, hallucinations, self-injury, sleep disturbance and suicidal ideas. The patient is nervous/anxious. The patient is not hyperactive.     Blood pressure (!) 152/80, pulse (!) 47, temperature 97.7 F (36.5 C), temperature source Oral, resp. rate 18, height 5' 7.5" (1.715 m), weight 103 kg, SpO2 100 %.Body mass index is 35.03 kg/m.  General Appearance: Fairly Groomed  Eye Contact:  Good  Speech:  Pressured  Volume:  Increased  Mood:  Irritable  Affect:  Congruent  Thought Process:  Coherent  Orientation:  Full (Time, Place, and Person)  Thought Content:  Illogical  Suicidal Thoughts:  No  Homicidal Thoughts:  No  Memory:  Immediate;   Fair Recent;   Fair  Remote;   Fair  Judgement:  Fair  Insight:  Lacking  Psychomotor Activity:  Normal  Concentration:  Concentration: Fair  Recall:  [employer] of Knowledge:  Fair  Language:  Good  Akathisia:  No  Handed:  Right  AIMS (if indicated):     Assets:  Communication Skills Housing Leisure Time  ADL's:  Intact  Cognition:  WNL  Sleep:  Number of Hours: 4.25    Treatment Plan Summary: Daily contact with patient to assess and evaluate symptoms and progress in treatment and Medication management   Inpatient hospitalization.  Start agitation protocol PRN agitation Continue losartan 50 mg PO daily for HTN Continue Ativan 1 mg PO QHS PRN RLS Continue Flomax 0.4 mg PO daily for BPH Continue thiamine 100 mg PO daily, vitamin B12 500 mcg PO daily, vitamin D 2000 units PO daily for hx bariatric surgery  Patient will participate in the therapeutic group milieu.  Discharge disposition in progress.   Observation Level/Precautions:  15 minute checks  Laboratory:   a1c  Psychotherapy:  Group therapy  Medications:  See MAR  Consultations:  PRN  Discharge Concerns:   Safety and stabilization  Estimated LOS: 3-5 days  Other:     Physician Treatment Plan for Primary Diagnosis: <principal problem not specified> Long Term Goal(s): Improvement in symptoms so as ready for discharge  Short Term Goals: Ability to identify changes in lifestyle to reduce recurrence of condition will improve, Ability to verbalize feelings will improve and Ability to disclose and discuss suicidal ideas  Physician Treatment Plan for Secondary Diagnosis: Active Problems:   MDD (major depressive disorder)  Long Term Goal(s): Improvement in symptoms so as ready for discharge  Short Term Goals: Ability to demonstrate self-control will improve and Ability to identify and develop effective coping behaviors will improve  I certify that inpatient services furnished can reasonably be expected to improve the patient's condition.    Connye Burkitt, NP 10/19/202112:35 PM

## 2020-06-06 NOTE — Plan of Care (Signed)
Nurse discussed anxiety, depression and coping skills with patient.  

## 2020-06-06 NOTE — [facility] Suicide Risk Assessment (Signed)
[facility] INPATIENT:  Family/Significant Other Suicide Prevention Education  Suicide Prevention Education:  Patient Refusal for Family/Significant Other Suicide Prevention Education: The patient Tanner Meyers has refused to provide written consent for family/significant other to be provided Family/Significant Other Suicide Prevention Education during admission and/or prior to discharge.  Physician notified.  SPE completed with patient.  Pamphlet provided for patient's review.    Frutoso Chase [name] 06/06/2020, 10:26 AM

## 2020-06-06 NOTE — Progress Notes (Signed)
Pt is calm and pleasant tonight. Reports that he was agitated earlier because he needed to make a phone call but he didn't have the phone number. He said he had requested to use his cell phone that was secured in his locker to obtain the number, but his request wasn't fulfilled until he asked several times. Pt said that he just needed to get his situation sorted out in regards to collecting his money which he plans to do on Thursday and then close on his house on Friday. That way then he can work on moving down to [place], he still has things that he needs to pack up. Pt said that he is relieved now. Pt denies SI/HI and AVH. Active listening, reassurance, and support provided. Q 15 min safety checks initiated. Pt's safety has been maintained.   06/06/20 2050  Psych Admission Type (Psych Patients Only)  Admission Status Involuntary  Psychosocial Assessment  Patient Complaints Worrying  Eye Contact Fair  Facial Expression Animated;Anxious  Affect Appropriate to circumstance  Speech Logical/coherent  Interaction Assertive  Motor Activity Fidgety  Appearance/Hygiene Unremarkable  Behavior Characteristics Cooperative;Anxious;Calm  Mood Anxious;Pleasant  Thought Process  Coherency WDL  Content Blaming others  Delusions None reported or observed  Perception WDL  Hallucination None reported or observed  Judgment Limited  Confusion None  Danger to Self  Current suicidal ideation? Denies  Danger to Others  Danger to Others None reported or observed

## 2020-06-07 DIAGNOSIS — T1491XA Suicide attempt, initial encounter: Secondary | ICD-10-CM

## 2020-06-07 LAB — HEMOGLOBIN A1C
Hgb A1c MFr Bld: 5.4 % (ref 4.8–5.6)
Mean Plasma Glucose: 108 mg/dL

## 2020-06-07 NOTE — Progress Notes (Addendum)
[facility] MD Progress Note  06/07/2020 3:54 PM Tanner Meyers  MRN:  [id number] Subjective:  "I'm doing great. I'm ready to go."  Tanner Meyers found sitting in the dayroom. He has been visible and interacting with others in the unit. He appears euthymic and reports good mood. He claims that he was playing a "practical joke" on his brother and pretending to hang himself but states "I learned my lesson. I won't do that again." He is requesting discharge but less agitated concerning discharge at this time. He has been attending groups and reports he is learning valuable information in groups. He reports good sleep overnight. He denies any SI/HI/AVH. He continues to decline psychotropic medications and denies anxiety or depression.  From admission H&P: Tanner Meyers is a 59 year old male with no reported psychiatric history, who presented to [facility] after police were called by his family. He had texted family members telling them that he loved them. His niece and brother reportedly then saw him standing on a chair in his yard with a rope around his neck. Police were called, and the patient attempted to leave in his truck when police arrived.  Principal Problem: Suicide attempt [facility]) Diagnosis: Principal Problem:   Suicide attempt [facility]) Active Problems:   MDD (major depressive disorder)  Total Time spent with patient: 15 minutes  Past Psychiatric History: See admission H&P  Past Medical History:  Past Medical History:  Diagnosis Date  . Arthritis   . Dyslexia   . GERD (gastroesophageal reflux disease)   . History of kidney stones   . Hypertension   . Kidney stones   . Shortness of breath    SOB with no exertion  . Sleep apnea    supposed to wear BiPaP machine at night, but doesn't  . Umbilical hernia     Past Surgical History:  Procedure Laterality Date  . BACK SURGERY  12/12/2002   [facility]  . COLONOSCOPY N/A 03/01/2013   SWF:UXNATFT and rectal polyps-removed as described above/Colonic  diverticulosis  . ESOPHAGOGASTRODUODENOSCOPY (EGD) WITH ESOPHAGEAL DILATION     [facility]  . FOOT SURGERY Right 2009   [facility]  . GASTRIC BYPASS    . TOTAL KNEE ARTHROPLASTY Bilateral Rt-2012, Lt-2010   [facility]  . [place]   Family History: History reviewed. No pertinent family history. Family Psychiatric  History: See admission H&P Social History:  Social History   Substance and Sexual Activity  Alcohol Use No   Comment: last drink several months ago     Social History   Substance and Sexual Activity  Drug Use No    Social History   Socioeconomic History  . Marital status: Single    Spouse name: Not on file  . Number of children: Not on file  . Years of education: Not on file  . Highest education level: Not on file  Occupational History  . Not on file  Tobacco Use  . Smoking status: Former Smoker    Years: 17.00    Types: Cigarettes  . Smokeless tobacco: Current User  . Tobacco comment: uses dipping tobacco every day, can use 1 can in 1-1.5 days  Vaping Use  . Vaping Use: Never used  Substance and Sexual Activity  . Alcohol use: No    Comment: last drink several months ago  . Drug use: No  . Sexual activity: Yes  Other Topics Concern  . Not on file  Social History Narrative  . Not on file   Social Determinants of Health   Financial Resource Strain:   . Difficulty of Paying Living Expenses: Not on file  Food Insecurity:   . Worried About Charity fundraiser in the Last Year: Not on file  . Ran Out of Food in the Last Year: Not on file  Transportation Needs:   . Lack of Transportation (Medical): Not on file  . Lack of Transportation (Non-Medical): Not on file  Physical Activity:   . Days of Exercise per Week: Not on file  . Minutes of Exercise per Session: Not on file  Stress:   . Feeling of Stress : Not on file  Social Connections:   . Frequency of Communication with Friends  and Family: Not on file  . Frequency of Social Gatherings with Friends and Family: Not on file  . Attends Religious Services: Not on file  . Active Member of Clubs or Organizations: Not on file  . Attends Archivist Meetings: Not on file  . Marital Status: Not on file   Additional Social History:                         Sleep: Good  Appetite:  Good  Current Medications: Current Facility-Administered Medications  Medication Dose Route Frequency Provider Last Rate Last Admin  . albuterol (VENTOLIN HFA) 108 (90 Base) MCG/ACT inhaler 2 puff  2 puff Inhalation Q6H PRN Mordecai Maes, NP      . OLANZapine zydis (ZYPREXA) disintegrating tablet 5 mg  5 mg Oral Q8H PRN Connye Burkitt, NP       And  . LORazepam (ATIVAN) tablet 1 mg  1 mg Oral PRN Connye Burkitt, NP       And  . ziprasidone (GEODON) injection 20 mg  20 mg Intramuscular PRN Connye Burkitt, NP      . LORazepam (ATIVAN) tablet 1 mg  1 mg Oral QHS PRN Connye Burkitt, NP   1 mg at 06/06/20 2050  . losartan (COZAAR) tablet 50 mg  50 mg Oral Daily Mordecai Maes, NP   50 mg at 06/07/20 0750  . ondansetron (ZOFRAN-ODT) disintegrating tablet 4 mg  4 mg Oral Q8H PRN Mordecai Maes, NP      . tamsulosin (FLOMAX) capsule 0.4 mg  0.4 mg Oral Daily Mordecai Maes, NP   0.4 mg at 06/07/20 0750  . thiamine tablet 100 mg  100 mg Oral Daily Mordecai Maes, NP   100 mg at 06/07/20 0750  . traZODone (DESYREL) tablet 50 mg  50 mg Oral QHS PRN Mordecai Maes, NP   50 mg at 06/06/20 2050  . vitamin B-12 (CYANOCOBALAMIN) tablet 500 mcg  500 mcg Oral Daily Mordecai Maes, NP   500 mcg at 06/07/20 0750  . Vitamin D3 (Vitamin D) tablet 2,000 Units  2,000 Units Oral Daily Mordecai Maes, NP   2,000 Units at 06/07/20 0750    Lab Results:  Results for orders placed or performed during the hospital encounter of 06/05/20 (from the past 48 hour(s))  Hemoglobin A1c     Status: None   Collection Time: 06/06/20  6:29 AM   Result Value Ref Range   Hgb A1c MFr Bld 5.4 4.8 - 5.6 %    Comment: (NOTE)         Prediabetes: 5.7 - 6.4         Diabetes: >6.4  Glycemic control for adults with diabetes: <7.0    Mean Plasma Glucose 108 mg/dL    Comment: (NOTE) Performed At: [facility] [place], [place] [id number] Rush Farmer MD DZ:[id number]   Lipid panel     Status: None   Collection Time: 06/06/20  6:29 AM  Result Value Ref Range   Cholesterol 153 0 - 200 mg/dL   Triglycerides 59 <150 mg/dL   HDL 56 >40 mg/dL   Total CHOL/HDL Ratio 2.7 RATIO   VLDL 12 0 - 40 mg/dL   LDL Cholesterol 85 0 - 99 mg/dL    Comment:        Total Cholesterol/HDL:CHD Risk Coronary Heart Disease Risk Table                     Men   Women  1/2 Average Risk   3.4   3.3  Average Risk       5.0   4.4  2 X Average Risk   9.6   7.1  3 X Average Risk  23.4   11.0        Use the calculated Patient Ratio above and the CHD Risk Table to determine the patient's CHD Risk.        ATP III CLASSIFICATION (LDL):  <100     mg/dL   Optimal  100-129  mg/dL   Near or Above                    Optimal  130-159  mg/dL   Borderline  160-189  mg/dL   High  >190     mg/dL   Very High Performed at [facility] [address] [place], [place] [address]   TSH     Status: None   Collection Time: 06/06/20  6:29 AM  Result Value Ref Range   TSH 2.458 0.350 - 4.500 uIU/mL    Comment: Performed by a 3rd Generation assay with a functional sensitivity of <=0.01 uIU/mL. Performed at [facility], [place] [address], [place],  [address]     Blood Alcohol level:  Lab Results  Component Value Date   ETH <10 89/21/1940    Metabolic Disorder Labs: Lab Results  Component Value Date   HGBA1C 5.4 06/06/2020   MPG 108 06/06/2020   No results found for: PROLACTIN Lab Results  Component Value Date   CHOL 153 06/06/2020   TRIG 59 06/06/2020   HDL 56 06/06/2020   CHOLHDL  2.7 06/06/2020   VLDL 12 06/06/2020   Rains 85 06/06/2020    Physical Findings: AIMS:  , ,  ,  ,    CIWA:    COWS:     Musculoskeletal: Strength & Muscle Tone: within normal limits Gait & Station: normal Patient leans: N/A  Psychiatric Specialty Exam: Physical Exam Vitals and nursing note reviewed.  Constitutional:      Appearance: He is well-developed.  Cardiovascular:     Rate and Rhythm: Normal rate.  Pulmonary:     Effort: Pulmonary effort is normal.  Neurological:     Mental Status: He is alert and oriented to person, place, and time.     Review of Systems  Constitutional: Negative.   Respiratory: Negative for cough and shortness of breath.   Psychiatric/Behavioral: Negative for agitation, behavioral problems, confusion, decreased concentration, dysphoric mood, hallucinations, self-injury, sleep disturbance and suicidal ideas. The patient is not nervous/anxious and is not hyperactive.  Blood pressure 128/82, pulse 65, temperature 98 F (36.7 C), temperature source Oral, resp. rate 18, height 5' 7.5" (1.715 m), weight 103 kg, SpO2 97 %.Body mass index is 35.03 kg/m.  General Appearance: Casual  Eye Contact:  Good  Speech:  Normal Rate  Volume:  Normal  Mood:  Euthymic  Affect:  Appropriate and Congruent  Thought Process:  Coherent and Goal Directed  Orientation:  Full (Time, Place, and Person)  Thought Content:  Logical  Suicidal Thoughts:  No  Homicidal Thoughts:  No  Memory:  Immediate;   Fair Recent;   Fair  Judgement:  Intact  Insight:  Lacking  Psychomotor Activity:  Normal  Concentration:  Concentration: Good and Attention Span: Good  Recall:  Good  Fund of Knowledge:  Fair  Language:  Good  Akathisia:  No  Handed:  Right  AIMS (if indicated):     Assets:  Communication Skills Desire for Improvement Housing Social Support  ADL's:  Intact  Cognition:  WNL  Sleep:  Number of Hours: 5.25     Treatment Plan Summary: Daily contact with  patient to assess and evaluate symptoms and progress in treatment and Medication management   Continue inpatient hospitalization.  Continue agitation protocol PRN agitation Continue losartan 50 mg PO daily for HTN Continue Ativan 1 mg PO QHS PRN RLS Continue Flomax 0.4 mg PO daily for BPH Continue thiamine 100 mg PO daily, vitamin B12 500 mcg PO daily, vitamin D 2000 units PO daily for hx bariatric surgery  Patient will participate in the therapeutic group milieu.  Discharge disposition in progress.   Connye Burkitt, NP 06/07/2020, 3:54 PM

## 2020-06-07 NOTE — Tx Team (Cosign Needed)
Interdisciplinary Treatment and Diagnostic Plan Update  06/07/2020 Time of Session: 9:41AM Tanner Meyers MRN: [id number]  Principal Diagnosis: Suicide attempt [facility])  Secondary Diagnoses: Principal Problem:   Suicide attempt [facility]) Active Problems:   MDD (major depressive disorder)   Current Medications:  Current Facility-Administered Medications  Medication Dose Route Frequency Provider Last Rate Last Admin  . albuterol (VENTOLIN HFA) 108 (90 Base) MCG/ACT inhaler 2 puff  2 puff Inhalation Q6H PRN Mordecai Maes, NP      . OLANZapine zydis (ZYPREXA) disintegrating tablet 5 mg  5 mg Oral Q8H PRN Connye Burkitt, NP       And  . LORazepam (ATIVAN) tablet 1 mg  1 mg Oral PRN Connye Burkitt, NP       And  . ziprasidone (GEODON) injection 20 mg  20 mg Intramuscular PRN Connye Burkitt, NP      . LORazepam (ATIVAN) tablet 1 mg  1 mg Oral QHS PRN Connye Burkitt, NP   1 mg at 06/06/20 2050  . losartan (COZAAR) tablet 50 mg  50 mg Oral Daily Mordecai Maes, NP   50 mg at 06/07/20 0750  . ondansetron (ZOFRAN-ODT) disintegrating tablet 4 mg  4 mg Oral Q8H PRN Mordecai Maes, NP      . tamsulosin (FLOMAX) capsule 0.4 mg  0.4 mg Oral Daily Mordecai Maes, NP   0.4 mg at 06/07/20 0750  . thiamine tablet 100 mg  100 mg Oral Daily Mordecai Maes, NP   100 mg at 06/07/20 0750  . traZODone (DESYREL) tablet 50 mg  50 mg Oral QHS PRN Mordecai Maes, NP   50 mg at 06/06/20 2050  . vitamin B-12 (CYANOCOBALAMIN) tablet 500 mcg  500 mcg Oral Daily Mordecai Maes, NP   500 mcg at 06/07/20 0750  . Vitamin D3 (Vitamin D) tablet 2,000 Units  2,000 Units Oral Daily Mordecai Maes, NP   2,000 Units at 06/07/20 0750   PTA Medications: Medications Prior to Admission  Medication Sig Dispense Refill Last Dose  . albuterol (PROVENTIL HFA;VENTOLIN HFA) 108 (90 BASE) MCG/ACT inhaler Inhale 2 puffs into the lungs every 6 (six) hours as needed for wheezing. (Patient not taking: Reported on 06/05/2020)      . aspirin 325 MG tablet Take 650 mg by mouth once. (Patient not taking: Reported on 06/05/2020)     . Cholecalciferol 50 MCG (2000 UT) TABS Take 2,000 Units by mouth daily.     . ergocalciferol (VITAMIN D2) 1.25 MG (50000 UT) capsule Take 50,000 Units by mouth once a week.     Marland Kitchen levofloxacin (LEVAQUIN) 500 MG tablet Take 1 tablet (500 mg total) by mouth daily. (Patient not taking: Reported on 06/05/2020) 7 tablet 0   . LORazepam (ATIVAN) 1 MG tablet Take 1 mg by mouth daily.     Marland Kitchen losartan (COZAAR) 50 MG tablet Take 50 mg by mouth daily. (Patient not taking: Reported on 06/05/2020)     . ondansetron (ZOFRAN ODT) 4 MG disintegrating tablet 98m ODT q4 hours prn nausea/vomit (Patient not taking: Reported on 06/05/2020) 12 tablet 0   . oxyCODONE-acetaminophen (PERCOCET/ROXICET) 5-325 MG tablet Take 1 tablet by mouth every 6 (six) hours as needed. (Patient not taking: Reported on 06/05/2020) 20 tablet 0   . peg 3350 powder (MOVIPREP) 100 G SOLR Take 1 kit (100 g total) by mouth as directed. (Patient not taking: Reported on 08/29/2014) 1 kit 0   . sildenafil (REVATIO) 20 MG tablet Take 20 mg by mouth  as needed.     . tamsulosin (FLOMAX) 0.4 MG CAPS capsule Take 1 capsule (0.4 mg total) by mouth daily. (Patient not taking: Reported on 06/05/2020) 5 capsule 0   . thiamine 100 MG tablet Take 100 mg by mouth in the morning and at bedtime.      . traZODone (DESYREL) 50 MG tablet Take 50 mg by mouth at bedtime as needed for sleep.  (Patient not taking: Reported on 06/05/2020)     . vitamin B-12 (CYANOCOBALAMIN) 500 MCG tablet Take 500 mcg by mouth daily.       Patient Stressors: Marital or family conflict Medication change or noncompliance  Patient Strengths: Active sense of humor Capable of independent living Supportive family/friends  Treatment Modalities: Medication Management, Group therapy, Case management,  1 to 1 session with clinician, Psychoeducation, Recreational therapy.   Physician  Treatment Plan for Primary Diagnosis: Suicide attempt [facility]) Long Term Goal(s): Improvement in symptoms so as ready for discharge Improvement in symptoms so as ready for discharge   Short Term Goals: Ability to identify changes in lifestyle to reduce recurrence of condition will improve Ability to verbalize feelings will improve Ability to disclose and discuss suicidal ideas Ability to demonstrate self-control will improve Ability to identify and develop effective coping behaviors will improve  Medication Management: Evaluate patient's response, side effects, and tolerance of medication regimen.  Therapeutic Interventions: 1 to 1 sessions, Unit Group sessions and Medication administration.  Evaluation of Outcomes: Not Met  Physician Treatment Plan for Secondary Diagnosis: Principal Problem:   Suicide attempt [facility]) Active Problems:   MDD (major depressive disorder)  Long Term Goal(s): Improvement in symptoms so as ready for discharge Improvement in symptoms so as ready for discharge   Short Term Goals: Ability to identify changes in lifestyle to reduce recurrence of condition will improve Ability to verbalize feelings will improve Ability to disclose and discuss suicidal ideas Ability to demonstrate self-control will improve Ability to identify and develop effective coping behaviors will improve     Medication Management: Evaluate patient's response, side effects, and tolerance of medication regimen.  Therapeutic Interventions: 1 to 1 sessions, Unit Group sessions and Medication administration.  Evaluation of Outcomes: Not Met   RN Treatment Plan for Primary Diagnosis: Suicide attempt [facility]) Long Term Goal(s): Knowledge of disease and therapeutic regimen to maintain health will improve  Short Term Goals: Ability to verbalize frustration and anger appropriately will improve, Ability to demonstrate self-control, Ability to verbalize feelings will improve and Ability to disclose and  discuss suicidal ideas  Medication Management: RN will administer medications as ordered by provider, will assess and evaluate patient's response and provide education to patient for prescribed medication. RN will report any adverse and/or side effects to prescribing provider.  Therapeutic Interventions: 1 on 1 counseling sessions, Psychoeducation, Medication administration, Evaluate responses to treatment, Monitor vital signs and CBGs as ordered, Perform/monitor CIWA, COWS, AIMS and Fall Risk screenings as ordered, Perform wound care treatments as ordered.  Evaluation of Outcomes: Not Met   LCSW Treatment Plan for Primary Diagnosis: Suicide attempt [facility]) Long Term Goal(s): Safe transition to appropriate next level of care at discharge, Engage patient in therapeutic group addressing interpersonal concerns.  Short Term Goals: Engage patient in aftercare planning with referrals and resources, Increase social support, Facilitate acceptance of mental health diagnosis and concerns and Increase skills for wellness and recovery  Therapeutic Interventions: Assess for all discharge needs, 1 to 1 time with Social worker, Explore available resources and support systems, Assess for  adequacy in community support network, Educate family and significant other(s) on suicide prevention, Complete Psychosocial Assessment, Interpersonal group therapy.  Evaluation of Outcomes: Not Met   Progress in Treatment: Attending groups: No. Participating in groups: No. Taking medication as prescribed: No. and As evidenced by:  Pt is currently declining to take medications Toleration medication: No. Family/Significant other contact made: Yes, individual(s) contacted:  Attending psychiatrist spoke with Pt's borther. Patient understands diagnosis: No. Discussing patient identified problems/goals with staff: Yes. Medical problems stabilized or resolved: Yes. Denies suicidal/homicidal ideation: Yes. Issues/concerns per  patient self-inventory: No. Other: None  New problem(s) identified: Yes, Describe:  Pt admitted for suicidal ideations. Pt lacks insight regarding mood instability and is minimizing gesture to hang himself.  New Short Term/Long Term Goal(s): Pt will be provided with community based resources to aid in continued mood improvement. SW will encourage Pt to attend groups to learn new and transferable coping skills.   Patient Goals: "Just like it was yesterday, to get out of here"  Discharge Plan or Barriers: SW will continue to assess. Pt is focused on going directly to [place] at discharge.  Reason for Continuation of Hospitalization: Aggression Anxiety Depression  Estimated Length of Stay: 3-5 Days   Attendees: Patient: Tanner Meyers 06/07/2020 10:41 AM  Physician: Harriett Sine, NP 06/07/2020 10:41 AM  Nursing:  06/07/2020 10:41 AM  RN Care Manager: 06/07/2020 10:41 AM  Social Worker: Freddi Che, LCSW 06/07/2020 10:41 AM  Recreational Therapist:  06/07/2020 10:41 AM  Other:  06/07/2020 10:41 AM  Other:  06/07/2020 10:41 AM  Other: 06/07/2020 10:41 AM    Scribe for Treatment Team: Freddi Che, LCSW 06/07/2020 10:41 AM

## 2020-06-07 NOTE — Progress Notes (Signed)
[facility] Group Notes:  (Nursing/MHT/Case Management/Adjunct)  Date:  06/07/2020  Time:  2030  Type of Therapy:  wrap up group  Participation Level:  Active  Participation Quality:  Appropriate, Attentive, Sharing and Supportive  Affect:  Appropriate  Cognitive:  Alert  Insight:  Lacking  Engagement in Group:  Engaged  Modes of Intervention:  Clarification, Education and Support  Summary of Progress/Problems: Positive thinking and positive change were discussed.   Shellia Cleverly 06/07/2020, 9:17 PM

## 2020-06-07 NOTE — [facility] Group Notes (Signed)
[place] LCSW Group Therapy  06/07/2020 2:20 PM  Type of Therapy:  Coping Skills Outside   Participation Level:  Active  Participation Quality:  Appropriate  Affect:  Anxious and Irritable  Cognitive:  Appropriate  Insight:  Lacking  Engagement in Therapy:  Developing/Improving  Modes of Intervention:  Activity  Summary of Progress/Problems: Tanner Meyers states that coping skills for him is getting away from things that upset him.  During group outside Tanner Meyers walked along the fence and stopped to talk to some peers every once in a while.  Tanner Meyers spoke with the CSW about wanting to discharge and how he felt that his brother was trying to make him appear incompetent to take his home.   Tanner Meyers 06/07/2020, 2:20 PM

## 2020-06-07 NOTE — Plan of Care (Signed)
Nurse discussed anxiety, depression and coping skills with patient.  

## 2020-06-08 ENCOUNTER — Encounter (HOSPITAL_COMMUNITY): Payer: Self-pay | Admitting: Psychiatry

## 2020-06-08 MED ORDER — ONDANSETRON 4 MG PO TBDP: 4.0000 mg | ORAL_TABLET | Freq: Three times a day (TID) | ORAL | 0 refills | Status: AC | PRN

## 2020-06-08 MED ORDER — TRAZODONE HCL 50 MG PO TABS: 50.0000 mg | ORAL_TABLET | Freq: Every evening | ORAL | 0 refills | Status: AC | PRN

## 2020-06-08 NOTE — Progress Notes (Signed)
Patient ID: Tanner Meyers, male   DOB: 1960/12/10, 59 y.o.   MRN: [id number] Patient verbalized readiness for discharge, denied SI/HI/AVH, and verbally contracted for safety outside of the hospital.  Pt stated that he is looking forward to moving to [place] and starting a new life there, and will be travelling tomorrow to close on the home that he is purchasing there.  Patient was educated on his discharge plan and verbalized understanding, left hospital with his discharge instructions, all personal belongings, and medication scripts.

## 2020-06-08 NOTE — Discharge Summary (Signed)
Physician Discharge Summary Note  Patient:  Tanner Meyers is an 59 y.o., male MRN:  [id number] DOB:  1960-12-24 Patient phone:  [phone] (home)  Patient address:   [place] [address],  Total Time spent with patient: 30 minutes  Date of Admission:  06/05/2020 Date of Discharge: 06/08/20   Reason for Admission:  Per HPI on Admission Assessment:  History of Present Illness: Mr. Tanner Meyers is a 59 year old male with no reported psychiatric history, who presented to [facility] after police were called by his family. He had texted family members telling them that he loved them. His niece and brother reportedly then saw him standing on a chair in his yard with a rope around his neck. Police were called, and the patient attempted to leave in his truck when police arrived. On assessment this morning, patient is quite agitated about being in the hospital and repeatedly requesting discharge. He admits that his wife left him last week but denies suicidal ideation. He claims that he was preparing to hang a tire swing in his yard, and was pulling on the rope to make sure it was stable, but accidentally wrapped it around his neck. He states that he left the home when a police officer arrived who had a history of negative interactions with his ex-wife. He is focused on discharging because he recently sold his home in [place] and is supposed to buy a new home in [place] tomorrow. He states that he wants to move to [place] to "start a new life" after his father's recent death and the end of his marriage. He states that he texted his family members that he loved them because he was moving to [place]. He minimizes recent separation, saying that his wife was an alcohol and drug addict, and that life will be better without her. He states he does not have contact information for his brother and niece who called the police but does provide another brother's phone number for collateral informationHerbie [place] [phone].  He does admit to sadness related to his father's death in 02-07-2020 but otherwise denies all symptoms of depression. He denies any SI/HI/AVH. Denies drug/alcohol use. UDS, BAL negative.  Principal Problem: Suicide attempt [facility]) Discharge Diagnoses: Principal Problem:   Suicide attempt [facility]) Active Problems:   MDD (major depressive disorder)   Past Psychiatric History: Patient denied prior psychiatric history, but did admit to alcohol abuse in his 52's stopped at 59 yr old, prior psychiatric evaluation when in prison during his 49's for larceny.  Denies prior diagnosis or medications  Past Medical History:  Past Medical History:  Diagnosis Date  . Arthritis   . Dyslexia   . GERD (gastroesophageal reflux disease)   . History of kidney stones   . Hypertension   . Kidney stones   . Shortness of breath    SOB with no exertion  . Sleep apnea    supposed to wear BiPaP machine at night, but doesn't  . Umbilical hernia     Past Surgical History:  Procedure Laterality Date  . BACK SURGERY  12/12/2002   [facility]  . COLONOSCOPY N/A 03/01/2013   INO:MVEHMCN and rectal polyps-removed as described above/Colonic diverticulosis  . ESOPHAGOGASTRODUODENOSCOPY (EGD) WITH ESOPHAGEAL DILATION     [facility]  . FOOT SURGERY Right 2009   Outpt [facility]-Unknown  . GASTRIC BYPASS    . TOTAL KNEE ARTHROPLASTY Bilateral Rt-2012, Lt-2010   [facility]  . UMBILICAL HERNIA REPAIR  [employer]   Family History: History reviewed. No pertinent family history. Family Psychiatric  History: Father alcohol use disorder Social History:  Social History   Substance and Sexual Activity  Alcohol Use No   Comment: last drink several months ago     Social History   Substance and Sexual Activity  Drug Use No    Social History   Socioeconomic History  . Marital status: Single    Spouse name: Not on file  . Number of children: Not on file  . Years of education: Not on file   . Highest education level: Not on file  Occupational History  . Not on file  Tobacco Use  . Smoking status: Former Smoker    Years: 17.00    Types: Cigarettes  . Smokeless tobacco: Current User  . Tobacco comment: uses dipping tobacco every day, can use 1 can in 1-1.5 days  Vaping Use  . Vaping Use: Never used  Substance and Sexual Activity  . Alcohol use: No    Comment: last drink several months ago  . Drug use: No  . Sexual activity: Yes  Other Topics Concern  . Not on file  Social History Narrative  . Not on file   Social Determinants of Health   Financial Resource Strain:   . Difficulty of Paying Living Expenses: Not on file  Food Insecurity:   . Worried About Charity fundraiser in the Last Year: Not on file  . Ran Out of Food in the Last Year: Not on file  Transportation Needs:   . Lack of Transportation (Medical): Not on file  . Lack of Transportation (Non-Medical): Not on file  Physical Activity:   . Days of Exercise per Week: Not on file  . Minutes of Exercise per Session: Not on file  Stress:   . Feeling of Stress : Not on file  Social Connections:   . Frequency of Communication with Friends and Family: Not on file  . Frequency of Social Gatherings with Friends and Family: Not on file  . Attends Religious Services: Not on file  . Active Member of Clubs or Organizations: Not on file  . Attends Archivist Meetings: Not on file  . Marital Status: Not on file    Hospital Course:  JAIVION KINGSLEY was admitted for Suicide attempt [facility]) and crisis management.  He/She was treated with the following medications Trazodone for sleep and Ativan for anxiety.  CHISTIAN KASLER was discharged with current medication and was instructed on how to take medications as prescribed; (details listed below under Medication List).  Medical problems were identified and treated as needed.  Home medications were restarted as appropriate.  Improvement was monitored by observation  and Bonne Dolores daily report of symptom reduction.  Emotional and mental status was monitored by daily self-inventory reports completed by Bonne Dolores and clinical staff.         BERRY GODSEY was evaluated by the treatment team for stability and plans for continued recovery upon discharge.  LIONELL MATUSZAK motivation was an integral factor for scheduling further treatment.  Employment, transportation, bed availability, health status, family support, and any pending legal issues were also considered during his hospital stay.  He was offered further treatment options upon discharge including but not limited to Residential, Intensive Outpatient, and Outpatient treatment.  KASHTYN JANKOWSKI will follow up with the services as listed below under Follow Up Information.     Upon  completion of this admission the POOKELA SELLIN was both mentally and medically stable for discharge denying suicidal/homicidal ideation, auditory/visual/tactile hallucinations, delusional thoughts and paranoia.      Physical Findings: AIMS:  , ,  ,  ,    CIWA:    COWS:     Musculoskeletal: Strength & Muscle Tone: within normal limits Gait & Station: normal Patient leans: N/A  Psychiatric Specialty Exam: Physical Exam Vitals and nursing note reviewed.  Constitutional:      Appearance: Normal appearance.  Pulmonary:     Effort: Pulmonary effort is normal.  Skin:    General: Skin is warm and dry.  Neurological:     Mental Status: He is alert and oriented to person, place, and time.     Review of Systems  Psychiatric/Behavioral: Negative for agitation, behavioral problems, hallucinations, self-injury and suicidal ideas.  All other systems reviewed and are negative.   Blood pressure 132/81, pulse (!) 57, temperature 98.6 F (37 C), temperature source Oral, resp. rate 18, height 5' 7.5" (1.715 m), weight 103 kg, SpO2 99 %.Body mass index is 35.03 kg/m.   General Appearance: Casual and Fairly Groomed  Chemical engineer::  Good  Speech:  Clear and Coherent and Normal   Volume:  Normal  Mood:  "good"  Affect:  Appropriate, Congruent and Full Range  Thought Process:  Coherent, Goal Directed and Linear  Orientation:  Full (Time, Place, and Person)  Thought Content:  Logical  Suicidal Thoughts:  No  Homicidal Thoughts:  No  Memory:  Immediate;   Fair Recent;   Fair  Judgement:  Fair  Insight:  Fair  Psychomotor Activity:  Normal  Concentration:  Good  Recall:  Good  Fund of Knowledge:Good  Language: Good  Akathisia:  No  Handed:  Right  AIMS (if indicated):     Assets:  Communication Skills Desire for Improvement Housing Resilience Social Support  Sleep:  Number of Hours: 3.5  Cognition: WNL  ADL's:  Intact      Has this patient used any form of tobacco in the last 30 days? (Cigarettes, Smokeless Tobacco, Cigars, and/or Pipes) Yes, Yes, Prescription not provided because: patient not interested  Blood Alcohol level:  Lab Results  Component Value Date   ETH <10 02/09/7627    Metabolic Disorder Labs:  Lab Results  Component Value Date   HGBA1C 5.4 06/06/2020   MPG 108 06/06/2020   No results found for: PROLACTIN Lab Results  Component Value Date   CHOL 153 06/06/2020   TRIG 59 06/06/2020   HDL 56 06/06/2020   CHOLHDL 2.7 06/06/2020   VLDL 12 06/06/2020   [place] 85 06/06/2020    See Psychiatric Specialty Exam and Suicide Risk Assessment completed by Attending Physician prior to discharge.  Discharge destination:  Home  Is patient on multiple antipsychotic therapies at discharge:  No   Has Patient had three or more failed trials of antipsychotic monotherapy by history:  No  Recommended Plan for Multiple Antipsychotic Therapies: NA  Discharge Instructions    Diet - low sodium heart healthy   Complete by: As directed    Increase activity slowly   Complete by: As directed      Allergies as of 06/08/2020   No Known Allergies     Medication List    STOP  taking these medications   ergocalciferol 1.25 MG (50000 UT) capsule Commonly known as: VITAMIN D2   sildenafil 20 MG tablet Commonly known as: REVATIO  TAKE these medications     Indication  albuterol 108 (90 Base) MCG/ACT inhaler Commonly known as: VENTOLIN HFA Inhale 2 puffs into the lungs every 6 (six) hours as needed for wheezing.  Indication: Asthma   Cholecalciferol 50 MCG (2000 UT) Tabs Take 2,000 Units by mouth daily.  Indication: Vitamin D Deficiency   LORazepam 1 MG tablet Commonly known as: ATIVAN Take 1 mg by mouth daily.  Indication: Feeling Anxious   losartan 50 MG tablet Commonly known as: COZAAR Take 50 mg by mouth daily.  Indication: High Blood Pressure Disorder   ondansetron 4 MG disintegrating tablet Commonly known as: Zofran ODT Take 1 tablet (4 mg total) by mouth every 8 (eight) hours as needed for nausea or vomiting. 4mg  ODT q4 hours prn nausea/vomit What changed:   how much to take  how to take this  when to take this  reasons to take this  Indication: Nausea and Vomiting   tamsulosin 0.4 MG Caps capsule Commonly known as: Flomax Take 1 capsule (0.4 mg total) by mouth daily.  Indication: Dysfunction of the Urinary Bladder   thiamine 100 MG tablet Take 100 mg by mouth in the morning and at bedtime.  Indication: Deficiency of Vitamin B1   traZODone 50 MG tablet Commonly known as: DESYREL Take 1 tablet (50 mg total) by mouth at bedtime as needed for sleep.  Indication: Trouble Sleeping   vitamin B-12 500 MCG tablet Commonly known as: CYANOCOBALAMIN Take 500 mcg by mouth daily.  Indication: Inadequate Vitamin B12       Follow-up Information    Services, [facility] Follow up.   Why: If you stay in this area, you may go to this provider for therapy and medication management services.   Contact information: [place] [address] [phone]              Follow-up recommendations:  Activity:  As  tolerated Diet:  Heart healthy  Comments:  XSAVIER SEELEY has been instructed to take medications as prescribed; and report adverse effects to outpatient provider.  Follow up with primary doctor for any medical issues and If symptoms recur report to nearest emergency or crisis hot line.    SignedEarleen Newport, NP 06/08/2020, 2:36 PM

## 2020-06-08 NOTE — Progress Notes (Signed)
  [facility] Adult Case Management Discharge Plan :  Will you be returning to the same living situation after discharge:  Yes,  personal residence At discharge, do you have transportation home?: Yes,  via brother Do you have the ability to pay for your medications: Yes,  has medicare  Release of information consent forms completed and in the chart;  Patient's signature needed at discharge.  Patient to Follow up at:   Next level of care provider has access to [place] and Suicide Prevention discussed: Yes,  with pt.     Has patient been referred to the Quitline?: Patient refused referral  Patient has been referred for addiction treatment: N/A  Mliss Fritz, Latanya Presser 06/08/2020, 9:42 AM

## 2020-06-08 NOTE — BHH Group Notes (Signed)
The focus of this group is to help patients establish daily goals to achieve during treatment and discuss how the patient can incorporate goal setting into their daily lives to aide in recovery.  Pt di not attend

## 2020-06-08 NOTE — Progress Notes (Signed)
   06/07/20 2346  Psych Admission Type (Psych Patients Only)  Admission Status Involuntary  Psychosocial Assessment  Patient Complaints None  Eye Contact Fair  Facial Expression Animated;Anxious  Affect Appropriate to circumstance  Speech Logical/coherent  Interaction Assertive  Motor Activity Fidgety  Appearance/Hygiene Unremarkable  Behavior Characteristics Anxious  Mood Anxious  Thought Process  Coherency WDL  Content Blaming others  Delusions None reported or observed  Perception WDL  Hallucination None reported or observed  Judgment Limited  Confusion None  Danger to Self  Current suicidal ideation? Denies  Danger to Others  Danger to Others None reported or observed  D: Patient in dayroom interacting well with peers. Pt reported he is tolerating medication well.  A: Medications administered as prescribed. Support and encouragement provided as needed.  R: Patient remains safe on the unit. Will continue to monitor for safety and stability.

## 2020-06-08 NOTE — BHH Suicide Risk Assessment (Signed)
[facility] Discharge Suicide Risk Assessment   Principal Problem: Suicide attempt [facility]) Discharge Diagnoses: Principal Problem:   Suicide attempt [facility]) Active Problems:   MDD (major depressive disorder)   Total Time spent with patient: 15 minutes  Musculoskeletal: Strength & Muscle Tone: within normal limits Gait & Station: normal Patient leans: N/A  Psychiatric Specialty Exam: Review of Systems  Constitutional: Negative for activity change and chills.  Respiratory: Negative for shortness of breath.   Cardiovascular: Negative for chest pain.  Gastrointestinal: Negative for abdominal pain.    Blood pressure 132/81, pulse (!) 57, temperature 98.6 F (37 C), temperature source Oral, resp. rate 18, height 5' 7.5" (1.715 m), weight 103 kg, SpO2 99 %.Body mass index is 35.03 kg/m.  General Appearance: Casual and Fairly Groomed  Eye Contact::  Good  Speech:  Clear and Coherent and Normal Rate409  Volume:  Normal  Mood:  "good"  Affect:  Appropriate, Congruent and Full Range  Thought Process:  Coherent, Goal Directed and Linear  Orientation:  Full (Time, Place, and Person)  Thought Content:  Logical  Suicidal Thoughts:  No  Homicidal Thoughts:  No  Memory:  Immediate;   Fair Recent;   Fair  Judgement:  Fair  Insight:  Fair  Psychomotor Activity:  Normal  Concentration:  Good  Recall:  Good  Fund of Knowledge:Good  Language: Good  Akathisia:  No  Handed:  Right  AIMS (if indicated):     Assets:  Communication Skills Desire for Improvement Housing Resilience Social Support  Sleep:  Number of Hours: 3.5  Cognition: WNL  ADL's:  Intact   Mental Status Per Nursing Assessment::   On Admission:  Suicidal ideation indicated by others  Demographic Factors:  Male, Caucasian, Low socioeconomic status, Living alone and Unemployed  Loss Factors: Loss of significant relationship  Historical Factors: Impulsivity  Risk Reduction Factors:   Positive social support and Positive  coping skills or problem solving skills Future thinking; looking forward to moving to [place]; recently purchased a new home  Continued Clinical Symptoms:  n/a  Cognitive Features That Contribute To Risk:  None    Suicide Risk:  Minimal: No identifiable suicidal ideation.  Patients presenting with no risk factors but with morbid ruminations; may be classified as minimal risk based on the severity of the depressive symptoms    Plan Of Care/Follow-up recommendations:  Activity:  as tolerated Diet:  regular Other:     Patient is instructed prior to discharge to: Take all medications as prescribed by his/her mental healthcare provider. Report any adverse effects and or reactions from the medicines to his/her outpatient provider promptly. Patient has been instructed & cautioned: To not engage in alcohol and or illegal drug use while on prescription medicines. In the event of worsening symptoms, patient is instructed to call the crisis hotline, 911 and or go to the nearest ED for appropriate evaluation and treatment of symptoms. To follow-up with his/her primary care provider for your other medical issues, concerns and or health care needs.     Ival Bible, MD 06/08/2020, 9:46 AM

## 2020-09-21 ENCOUNTER — Encounter: Payer: Self-pay | Admitting: Emergency Medicine

## 2020-09-21 ENCOUNTER — Other Ambulatory Visit: Payer: Self-pay

## 2020-09-21 ENCOUNTER — Ambulatory Visit
Admission: EM | Admit: 2020-09-21 | Discharge: 2020-09-21 | Disposition: A | Discharge status: Home / Self Care | Payer: Medicare Other | Attending: Emergency Medicine | Admitting: Emergency Medicine

## 2020-09-21 DIAGNOSIS — T7840XA Allergy, unspecified, initial encounter: Secondary | ICD-10-CM

## 2020-09-21 MED ORDER — PREDNISONE 10 MG PO TABS: 20.0000 mg | ORAL_TABLET | Freq: Every day | ORAL | 0 refills | Status: AC

## 2020-09-21 MED ORDER — CETIRIZINE HCL 10 MG PO TABS: 10.0000 mg | ORAL_TABLET | Freq: Every day | ORAL | 0 refills | Status: AC

## 2020-09-21 MED ORDER — DEXAMETHASONE SODIUM PHOSPHATE 10 MG/ML IJ SOLN
10.0000 mg | Freq: Once | INTRAMUSCULAR | Status: AC
  Administered 2020-09-21: 10 mg via INTRAMUSCULAR

## 2020-09-21 NOTE — ED Provider Notes (Signed)
[facility]   [id number] 09/21/20 Arrival Time: 1311  Cc: Allergic reaction  SUBJECTIVE:  Tanner Meyers is a 60 y.o. male who presented to the urgent care with a complaint of right side of his nose and lips itching and numb for the past 2 days.  Stated insulation fell on his face and developed the symptoms thereafter. Denies changes in medication or starting a new medication.  Has not tried any OTC medication.  Denies alleviating or aggravating factors.  Denies previous symptoms in the past.   Denies fever, chills, nausea, vomiting, erythema, redness, swollen glands, oral manifestations such as throat swelling/ tingling, mouth swelling/ tingling, tongue swelling/tingling, dyspnea, SOB, chest pain, abdominal pain, changes in bowel or bladder function.     ROS: As per HPI.  All other pertinent ROS negative.      Past Medical History:  Diagnosis Date  . Arthritis   . Dyslexia   . GERD (gastroesophageal reflux disease)   . History of kidney stones   . Hypertension   . Kidney stones   . Shortness of breath    SOB with no exertion  . Sleep apnea    supposed to wear BiPaP machine at night, but doesn't  . Umbilical hernia    Past Surgical History:  Procedure Laterality Date  . BACK SURGERY  12/12/2002   [facility]  . COLONOSCOPY N/A 03/01/2013   JOA:CZYSAYT and rectal polyps-removed as described above/Colonic diverticulosis  . ESOPHAGOGASTRODUODENOSCOPY (EGD) WITH ESOPHAGEAL DILATION     [facility]  . FOOT SURGERY Right 2009   Outpt Clinic-Unknown  . GASTRIC BYPASS    . TOTAL KNEE ARTHROPLASTY Bilateral Rt-2012, Lt-2010   [facility]  . UMBILICAL HERNIA REPAIR     [facility]   No Known Allergies No current facility-administered medications on file prior to encounter.   Current Outpatient Medications on File Prior to Encounter  Medication Sig Dispense Refill  . albuterol (PROVENTIL HFA;VENTOLIN HFA) 108 (90 BASE) MCG/ACT inhaler Inhale 2  puffs into the lungs every 6 (six) hours as needed for wheezing. (Patient not taking: Reported on 06/05/2020)    . Cholecalciferol 50 MCG (2000 UT) TABS Take 2,000 Units by mouth daily.    Marland Kitchen LORazepam (ATIVAN) 1 MG tablet Take 1 mg by mouth daily.    Marland Kitchen losartan (COZAAR) 50 MG tablet Take 50 mg by mouth daily. (Patient not taking: Reported on 06/05/2020)    . ondansetron (ZOFRAN ODT) 4 MG disintegrating tablet Take 1 tablet (4 mg total) by mouth every 8 (eight) hours as needed for nausea or vomiting. 4mg  ODT q4 hours prn nausea/vomit 12 tablet 0  . tamsulosin (FLOMAX) 0.4 MG CAPS capsule Take 1 capsule (0.4 mg total) by mouth daily. (Patient not taking: Reported on 06/05/2020) 5 capsule 0  . thiamine 100 MG tablet Take 100 mg by mouth in the morning and at bedtime.     . traZODone (DESYREL) 50 MG tablet Take 1 tablet (50 mg total) by mouth at bedtime as needed for sleep. 30 tablet 0  . vitamin B-12 (CYANOCOBALAMIN) 500 MCG tablet Take 500 mcg by mouth daily.      Social History   Socioeconomic History  . Marital status: Single    Spouse name: Not on file  . Number of children: Not on file  . Years of education: Not on file  . Highest education level: Not on file  Occupational History  . Not on file  Tobacco Use  .  Smoking status: Former Smoker    Years: 17.00    Types: Cigarettes  . Smokeless tobacco: Current User  . Tobacco comment: uses dipping tobacco every day, can use 1 can in 1-1.5 days  Vaping Use  . Vaping Use: Never used  Substance and Sexual Activity  . Alcohol use: No    Comment: last drink several months ago  . Drug use: No  . Sexual activity: Yes  Other Topics Concern  . Not on file  Social History Narrative  . Not on file   Social Determinants of Health   Financial Resource Strain: Not on file  Food Insecurity: Not on file  Transportation Needs: Not on file  Physical Activity: Not on file  Stress: Not on file  Social Connections: Not on file  Intimate  Partner Violence: Not on file   No family history on file.   OBJECTIVE:  Vitals:   09/21/20 1325 09/21/20 1326  BP: (!) 157/89   Pulse: (!) 56   Resp: 18   Temp: 98.2 F (36.8 C)   TempSrc: Oral   SpO2: 97%   Weight:  220 lb (99.8 kg)     General appearance: Alert, speaking in full sentences without difficulty HEENT:NCAT; no obvious facial swelling; Ears: EACs clear, TMs pearly gray; Eyes: PERRL.  EOM grossly intact. Nose: nares patent without rhinorrhea; Throat: tonsils nonerythematous or enlarged, uvula midline Neck: supple without LAD Lungs: clear to auscultation bilaterally without adventitious breath sounds; normal respiratory effort; no labored respirations Heart: regular rate and rhythm.  Radial pulses 2+ symmetrical bilaterally; cap refill < 2 seconds Neurology: CN I through XII within normal limit Skin: warm and dry  ASSESSMENT & PLAN:  1. Allergic reaction, initial encounter     Meds ordered this encounter  Medications  . dexamethasone (DECADRON) injection 10 mg  . cetirizine (ZYRTEC ALLERGY) 10 MG tablet    Sig: Take 1 tablet (10 mg total) by mouth daily.    Dispense:  30 tablet    Refill:  0  . predniSONE (DELTASONE) 10 MG tablet    Sig: Take 2 tablets (20 mg total) by mouth daily for 7 days.    Dispense:  14 tablet    Refill:  0    No orders of the defined types were placed in this encounter.    Discharge instructions  Decadron 10 mg shot given in office Rest push fluids Prednisone and Zyrtec were prescribed . Return sooner or go to the ED if you have any new or worsening symptoms such as difficulty breathing, shortness of breath, chest pain, nausea, vomiting, throat tightness or swelling, tongue swelling or tingling, worsening lip or facial swelling, abdominal pain, changes in bowel or bladder habits, no improvement despite medications, etc...   Reviewed expectations re: course of current medical issues. Questions answered. Outlined signs and  symptoms indicating need for more acute intervention. Patient verbalized understanding. After Visit Summary given.          Emerson Monte, FNP 09/21/20 1400

## 2020-09-21 NOTE — Discharge Instructions (Addendum)
Decadron 10 mg shot given in office Rest push fluids Prednisone and Zyrtec were prescribed . Return sooner or go to the ED if you have any new or worsening symptoms such as difficulty breathing, shortness of breath, chest pain, nausea, vomiting, throat tightness or swelling, tongue swelling or tingling, worsening lip or facial swelling, abdominal pain, changes in bowel or bladder habits, no improvement despite medications, etc..Marland Kitchen

## 2020-09-21 NOTE — ED Triage Notes (Signed)
On Monday pt had insulation fall on his face.  On Tuesday the right side of his nose and above his lip is numb.

## 2021-09-20 ENCOUNTER — Other Ambulatory Visit (HOSPITAL_COMMUNITY): Payer: Self-pay | Admitting: Adult Health Nurse Practitioner

## 2021-09-20 ENCOUNTER — Other Ambulatory Visit: Payer: Self-pay

## 2021-09-20 ENCOUNTER — Ambulatory Visit (HOSPITAL_COMMUNITY)
Admission: RE | Admit: 2021-09-20 | Discharge: 2021-09-20 | Disposition: A | Discharge status: Home / Self Care | Payer: Medicare Other | Source: Ambulatory Visit | Attending: Adult Health Nurse Practitioner | Admitting: Adult Health Nurse Practitioner

## 2021-09-20 DIAGNOSIS — M25561 Pain in right knee: Secondary | ICD-10-CM

## 2021-11-09 ENCOUNTER — Encounter (HOSPITAL_COMMUNITY): Payer: Self-pay

## 2021-11-09 ENCOUNTER — Other Ambulatory Visit: Payer: Self-pay

## 2021-11-09 ENCOUNTER — Emergency Department (HOSPITAL_COMMUNITY): Payer: Medicare Other

## 2021-11-09 ENCOUNTER — Emergency Department (HOSPITAL_COMMUNITY)
Admission: EM | Admit: 2021-11-09 | Discharge: 2021-11-09 | Disposition: A | Discharge status: Home / Self Care | Payer: Medicare Other | Attending: Emergency Medicine | Admitting: Emergency Medicine

## 2021-11-09 DIAGNOSIS — E876 Hypokalemia: Secondary | ICD-10-CM | POA: Insufficient documentation

## 2021-11-09 DIAGNOSIS — R079 Chest pain, unspecified: Secondary | ICD-10-CM

## 2021-11-09 DIAGNOSIS — I10 Essential (primary) hypertension: Secondary | ICD-10-CM | POA: Diagnosis not present

## 2021-11-09 DIAGNOSIS — S299XXA Unspecified injury of thorax, initial encounter: Secondary | ICD-10-CM | POA: Diagnosis not present

## 2021-11-09 DIAGNOSIS — Y92009 Unspecified place in unspecified non-institutional (private) residence as the place of occurrence of the external cause: Secondary | ICD-10-CM | POA: Insufficient documentation

## 2021-11-09 LAB — BASIC METABOLIC PANEL
Anion gap: 9 (ref 5–15)
BUN: 17 mg/dL (ref 6–20)
CO2: 24 mmol/L (ref 22–32)
Calcium: 8.8 mg/dL — ABNORMAL LOW (ref 8.9–10.3)
Chloride: 107 mmol/L (ref 98–111)
Creatinine, Ser: 1.22 mg/dL (ref 0.61–1.24)
GFR, Estimated: >60 mL/min (ref >60)
Glucose, Bld: 143 mg/dL — ABNORMAL HIGH (ref 70–99)
Potassium: 3.2 mmol/L — ABNORMAL LOW (ref 3.5–5.1)
Sodium: 140 mmol/L (ref 135–145)

## 2021-11-09 LAB — CBC
HCT: 42.7 % (ref 39.0–52.0)
Hemoglobin: 14 g/dL (ref 13.0–17.0)
MCH: 28.5 pg (ref 26.0–34.0)
MCHC: 32.8 g/dL (ref 30.0–36.0)
MCV: 86.8 fL (ref 80.0–100.0)
Platelets: 237 10*3/uL (ref 150–400)
RBC: 4.92 MIL/uL (ref 4.22–5.81)
RDW: 12.8 % (ref 11.5–15.5)
WBC: 9.6 10*3/uL (ref 4.0–10.5)
nRBC: 0 % (ref 0.0–0.2)

## 2021-11-09 LAB — TROPONIN I (HIGH SENSITIVITY): Troponin I (High Sensitivity): 7 ng/L (ref <18)

## 2021-11-09 MED ORDER — ALUM & MAG HYDROXIDE-SIMETH 200-200-20 MG/5ML PO SUSP
30.0000 mL | Freq: Once | ORAL | Status: AC
  Administered 2021-11-09: 30 mL via ORAL
  Filled 2021-11-09: qty 30

## 2021-11-09 MED ORDER — POTASSIUM CHLORIDE CRYS ER 20 MEQ PO TBCR
40.0000 meq | EXTENDED_RELEASE_TABLET | Freq: Once | ORAL | Status: AC
  Administered 2021-11-09: 40 meq via ORAL
  Filled 2021-11-09: qty 2
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
## 2021-11-09 NOTE — Discharge Instructions (Signed)
Your work-up today was reassuring, follow-up with your primary if symptoms persist.  Return if things worsen ?
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
## 2022-03-16 ENCOUNTER — Encounter (HOSPITAL_COMMUNITY): Payer: Self-pay

## 2022-03-16 ENCOUNTER — Other Ambulatory Visit: Payer: Self-pay

## 2022-03-16 ENCOUNTER — Emergency Department (HOSPITAL_COMMUNITY)
Admission: EM | Admit: 2022-03-16 | Discharge: 2022-03-17 | Disposition: A | Discharge status: Home / Self Care | Payer: Medicare Other | Attending: Emergency Medicine | Admitting: Emergency Medicine

## 2022-03-16 DIAGNOSIS — I10 Essential (primary) hypertension: Secondary | ICD-10-CM | POA: Diagnosis not present

## 2022-03-16 DIAGNOSIS — Z79899 Other long term (current) drug therapy: Secondary | ICD-10-CM | POA: Insufficient documentation

## 2022-03-16 DIAGNOSIS — H16133 Photokeratitis, bilateral: Secondary | ICD-10-CM | POA: Insufficient documentation

## 2022-03-16 DIAGNOSIS — H5789 Other specified disorders of eye and adnexa: Secondary | ICD-10-CM | POA: Diagnosis present

## 2022-03-16 MED ORDER — TETRACAINE HCL 0.5 % OP SOLN
2.0000 [drp] | Freq: Once | OPHTHALMIC | Status: AC
  Administered 2022-03-16: 2 [drp] via OPHTHALMIC
  Filled 2022-03-16: qty 4

## 2022-03-16 MED ORDER — ERYTHROMYCIN 5 MG/GM OP OINT
TOPICAL_OINTMENT | Freq: Once | OPHTHALMIC | Status: AC
  Filled 2022-03-16: qty 7

## 2022-03-16 MED ORDER — FLUORESCEIN SODIUM 1 MG OP STRP
1.0000 | ORAL_STRIP | Freq: Once | OPHTHALMIC | Status: AC
  Administered 2022-03-16: 1 via OPHTHALMIC
  Filled 2022-03-16: qty 1

## 2022-03-16 MED ORDER — HYDROCODONE-ACETAMINOPHEN 5-325 MG PO TABS: 2.0000 | ORAL_TABLET | ORAL | 0 refills | Status: AC | PRN

## 2022-03-16 MED ORDER — ERYTHROMYCIN 5 MG/GM OP OINT: TOPICAL_OINTMENT | OPHTHALMIC | 0 refills | Status: AC

## 2022-03-16 NOTE — ED Notes (Signed)
Pt having a hard time keeping eyes open due to constant burning. PA and this RN expressed concerns with pt driving self home, pt states he has no one that is able to give him a ride and only live a few miles from hospital.  This RN called [facility] and asked them to escort pt to ensure pt makes it home safely. Sheriff agreed to do so. Pt made aware and is thankful.

## 2022-03-16 NOTE — Discharge Instructions (Addendum)
You have been provided with a short course of Vicodin.  He may take 1-2 tablets every 4-6 hours as needed for pain relief.  A prescription for erythromycin ophthalmic ointment has been sent to your pharmacy.  Please apply this 3-4 times per day for antibiotic prophylaxis and symptom relief.  It is very important that you follow-up with your ophthalmologist at [facility] on Monday for reevaluation as discussed.  Return to the ED for new or worsening symptoms as discussed.  These include but are not limited to vision changes, vision loss, eye discharge, severe headache with nausea and vomiting.

## 2022-03-16 NOTE — ED Provider Notes (Cosign Needed Addendum)
[facility] EMERGENCY DEPARTMENT Provider Note   CSN: [id number] Arrival date & time: 03/16/22  2136     History  Chief Complaint  Patient presents with   Eye Problem    Tanner Meyers is a 61 y.o. male with a history of MDD, HTN, GERD, umbilical hernia, prior renal calculi.  Presenting today with "burning" of both eyes.  Patient works as a Building control surveyor.  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  2 hours later had both of his eyes began having a burning sensation.  Denies vision loss or vision changes, but does endorse photophobia.  Denies eye pressure, N/V, or headache.  No other complaints at this time.  The history is provided by the patient and medical records.  Eye Problem Associated symptoms: photophobia and redness   Associated symptoms: no itching        Home Medications Prior to Admission medications   Medication Sig Start Date End Date Taking? Authorizing Provider  erythromycin ophthalmic ointment Place a 1/2 inch ribbon of ointment into the lower eyelid. 03/16/22  Yes Prince Rome, PA-C  HYDROcodone-acetaminophen (NORCO/VICODIN) 5-325 MG tablet Take 2 tablets by mouth every 4 (four) hours as needed. 03/16/22  Yes Prince Rome, PA-C  albuterol (PROVENTIL HFA;VENTOLIN HFA) 108 (90 BASE) MCG/ACT inhaler Inhale 2 puffs into the lungs every 6 (six) hours as needed for wheezing. Patient not taking: Reported on 06/05/2020    [provider]  cetirizine (ZYRTEC ALLERGY) 10 MG tablet Take 1 tablet (10 mg total) by mouth daily. 09/21/20   Emerson Monte, FNP  Cholecalciferol 50 MCG (2000 UT) TABS Take 2,000 Units by mouth daily.    [provider]  LORazepam (ATIVAN) 1 MG tablet Take 1 mg by mouth daily. 01/16/20   [provider]  losartan (COZAAR) 50 MG tablet Take 50 mg by mouth daily. Patient not taking: Reported on 06/05/2020    [provider]  ondansetron (ZOFRAN ODT) 4 MG disintegrating tablet  Take 1 tablet (4 mg total) by mouth every 8 (eight) hours as needed for nausea or vomiting. '4mg'$  ODT q4 hours prn nausea/vomit 06/08/20   Rankin, Shuvon B, NP  tamsulosin (FLOMAX) 0.4 MG CAPS capsule Take 1 capsule (0.4 mg total) by mouth daily. Patient not taking: Reported on 06/05/2020 09/15/18   Milton Ferguson, MD  thiamine 100 MG tablet Take 100 mg by mouth in the morning and at bedtime.     [provider]  traZODone (DESYREL) 50 MG tablet Take 1 tablet (50 mg total) by mouth at bedtime as needed for sleep. 06/08/20   Rankin, Shuvon B, NP  vitamin B-12 (CYANOCOBALAMIN) 500 MCG tablet Take 500 mcg by mouth daily.    [provider]      Allergies    Patient has no known allergies.    Review of Systems   Review of Systems  Eyes:  Positive for photophobia, pain and redness. Negative for itching and visual disturbance.    Physical Exam Updated Vital Signs BP (!) 179/95 (BP Location: Right Arm)   Pulse (!) 57   Temp 97.8 F (36.6 C) (Oral)   Resp 18   Ht 5' 7.5" (1.715 m)   Wt 105.7 kg   SpO2 100%   BMI 35.95 kg/m  Physical Exam Vitals and nursing note reviewed.  Constitutional:      General: He is not in acute distress.    Appearance: He is well-developed.  HENT:  Head: Normocephalic and atraumatic.  Eyes:     General: Lids are everted, no foreign bodies appreciated. Vision grossly intact. Gaze aligned appropriately. No visual field deficit.       Right eye: No foreign body or discharge.        Left eye: No foreign body or discharge.     Extraocular Movements: Extraocular movements intact.     Right eye: No nystagmus.     Left eye: No nystagmus.     Conjunctiva/sclera:     Right eye: Right conjunctiva is injected (bulbar, without tarsal involvement). Chemosis present. No exudate or hemorrhage.    Left eye: Left conjunctiva is injected (bulbar, without tarsal involvement). Chemosis present. No exudate or hemorrhage.    Pupils: Pupils are equal, round,  and reactive to light.     Right eye: No corneal abrasion or fluorescein uptake.     Left eye: No corneal abrasion or fluorescein uptake.     Visual Fields: Right eye visual fields normal and left eye visual fields normal.  Cardiovascular:     Rate and Rhythm: Normal rate and regular rhythm.  Pulmonary:     Effort: Pulmonary effort is normal. No respiratory distress.     Breath sounds: Normal breath sounds.  Abdominal:     Palpations: Abdomen is soft.     Tenderness: There is no abdominal tenderness.  Musculoskeletal:        General: No swelling.     Cervical back: Neck supple.  Skin:    General: Skin is warm and dry.     Capillary Refill: Capillary refill takes less than 2 seconds.     Comments: Mild erythema of eyelids and cheeks, nontender.  Neurological:     Mental Status: He is alert and oriented to person, place, and time.     GCS: GCS eye subscore is 4. GCS verbal subscore is 5. GCS motor subscore is 6.  Psychiatric:        Mood and Affect: Mood normal.     ED Results / Procedures / Treatments   Labs (all labs ordered are listed, but only abnormal results are displayed) Labs Reviewed - No data to display  EKG None  Radiology No results found.  Procedures Procedures    Medications Ordered in ED Medications  fluorescein ophthalmic strip 1 strip (has no administration in time range)  erythromycin ophthalmic ointment (has no administration in time range)  tetracaine (PONTOCAINE) 0.5 % ophthalmic solution 2 drop (2 drops Both Eyes Given 03/16/22 2237)    ED Course/ Medical Decision Making/ A&P                           Medical Decision Making  61 y.o. male presents to the ED for concern of Eye Problem   This involves an extensive number of treatment options, and is a complaint that carries with it a high risk of complications and morbidity.    Past Medical History / Co-morbidities / Social History: Works as a Building control surveyor.  Hx of MDD, HTN, GERD, umbilical hernia,  prior renal calculi. Social Determinants of Health include: Elderly  Additional History:  Internal and external records from outside source obtained and reviewed including: None  Lab Tests: None  Imaging Studies: None  ED Course: Pt well-appearing on exam.  Presenting with bilateral eye pain, described as burning sensation.  Works as a Building control surveyor.  While at work his eye protection fell off for part of his job.  Few hours later, the eye pain began.  No vision changes or vision loss, appears grossly intact on exam without evidence of visual field changes.  No evidence of discharge or hyphema.  Bilateral eye pain, conjunctival injection (specifically the bulbar conjunctiva), photophobia, and inability to keep eyes open for long periods appreciated on exam.  Very mild erythema of face and eyelids without significant tenderness.  No blistering or peeling.  No punctuate corneal staining appreciated on exam.   Clinical picture of bilateral photokeratitis due to welding trauma.  Mild improvement with tetracaine.  Recommended pain management with short course of Vicodin and topical erythromycin ointment.  Stressed importance of following up with his ophthalmologist on Monday for reevaluation and continued medical management, for which the patient is in agreement.  Also advised pt not to drive home with his condition as this is unsafe given his current state.  Patient states he does not have anybody available to bring him home.  Discussed this with the nurse, who was able to confirm with law enforcement that they will be able to escort him home.  Patient satisfied with today's encounter.  Patient in NAD and in good condition at time of discharge.  Disposition: After consideration the patient's encounter today, I do not feel today's workup suggests an emergent condition requiring admission or immediate intervention beyond what has been performed at this time.  Safe for discharge; instructed to return immediately  for worsening symptoms, change in symptoms or any other concerns.  I have reviewed the patients home medicines and have made adjustments as needed.  Discussed course of treatment with the patient, whom demonstrated understanding.  Patient in agreement and has no further questions.    I discussed this case with my attending physician Dr. Roderic Palau, who agreed with the proposed treatment course and cosigned this note including patient's presenting symptoms, physical exam, and planned diagnostics and interventions.  Attending physician stated agreement with plan or made changes to plan which were implemented.     This chart was dictated using voice recognition software.  Despite best efforts to proofread, errors can occur which can change the documentation meaning.         Final Clinical Impression(s) / ED Diagnoses Final diagnoses:  Photokeratitis of both eyes    Rx / DC Orders ED Discharge Orders          Ordered    erythromycin ophthalmic ointment        03/16/22 2312    HYDROcodone-acetaminophen (NORCO/VICODIN) 5-325 MG tablet  Every 4 hours PRN        03/16/22 2312              Prince Rome, PA-C 32/[id number]/01 [id number]
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
## 2022-03-18 MED FILL — Oxycodone w/ Acetaminophen Tab 5-325 MG: ORAL | Qty: 6 | Status: AC

## 2023-12-18 ENCOUNTER — Ambulatory Visit: Attending: Pain Medicine | Admitting: Rehabilitative and Restorative Service Providers"

## 2023-12-18 ENCOUNTER — Telehealth: Payer: Self-pay | Admitting: Rehabilitative and Restorative Service Providers"

## 2023-12-18 NOTE — Telephone Encounter (Signed)
 Called patient secondary to missed PT Evaluation scheduled on 12/18/2023 at 10:15 AM.  Left message for patient to please call back if he would like to reschedule this appointment.

## 2024-01-16 IMAGING — DX DG KNEE 3 VIEWS*R*
3 series · 3 of 3 positions shown · non-contrast
Comparison: X-ray 12/10/2009.

CLINICAL DATA: Right knee pain

EXAM:
RIGHT KNEE - 3 VIEW

[knee ap]
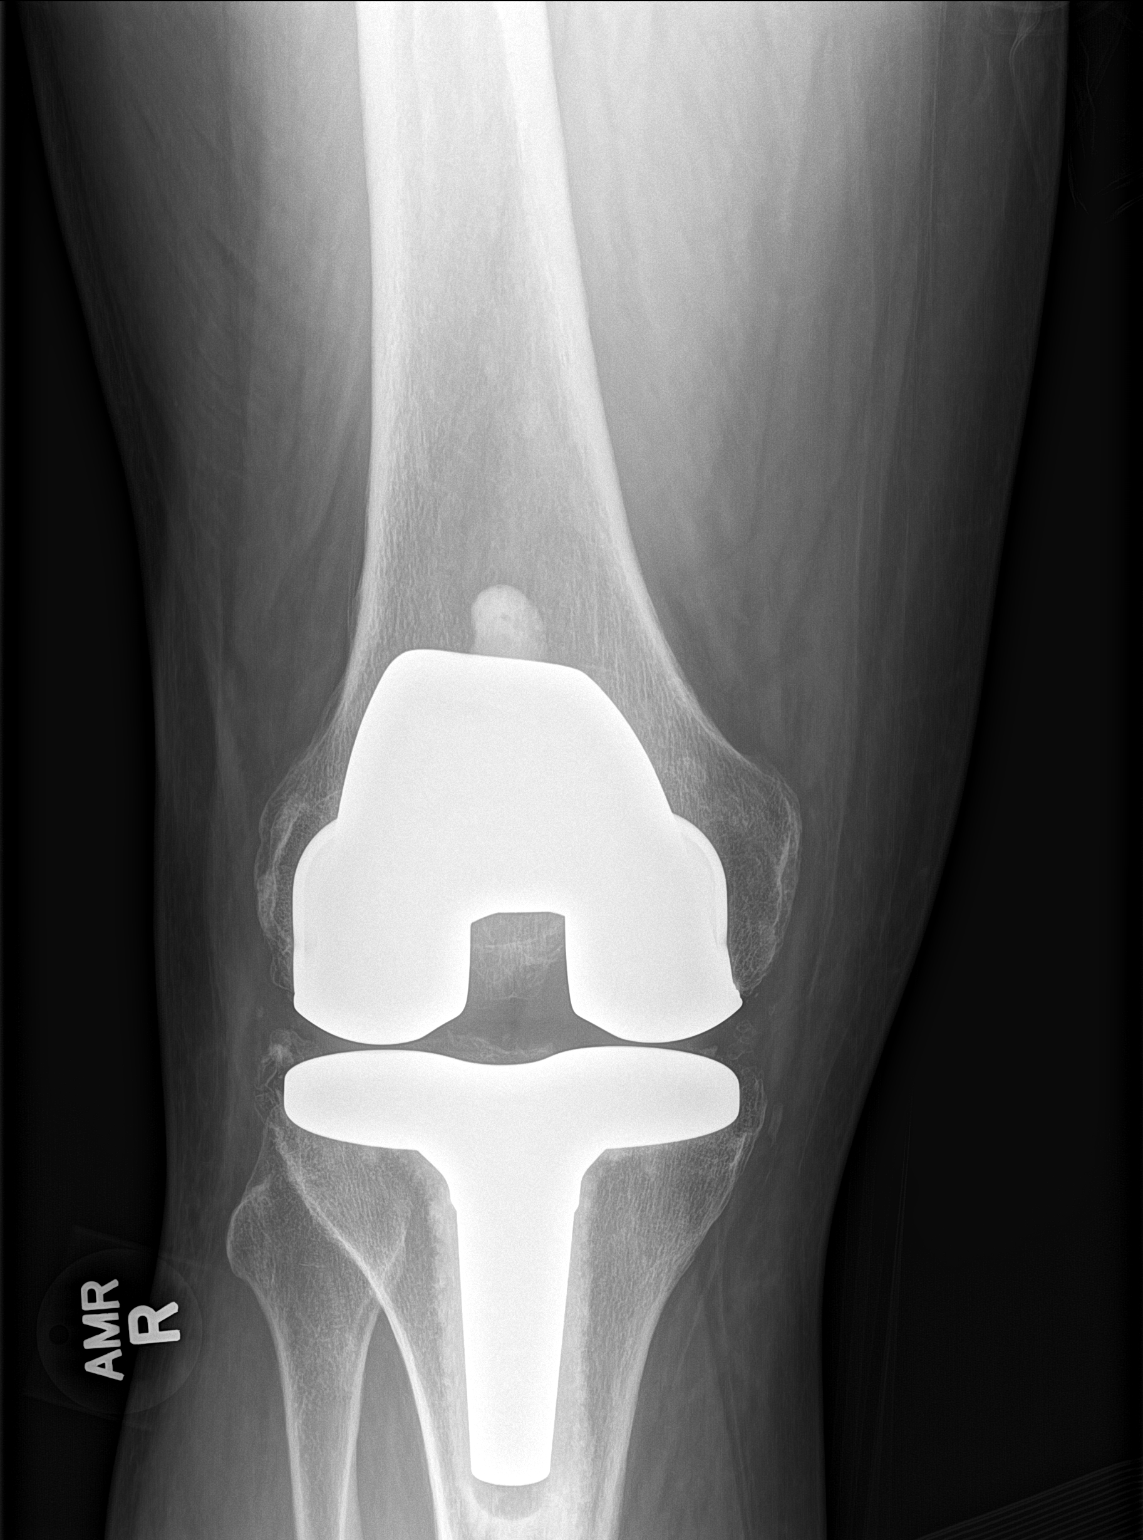

[knee obl]
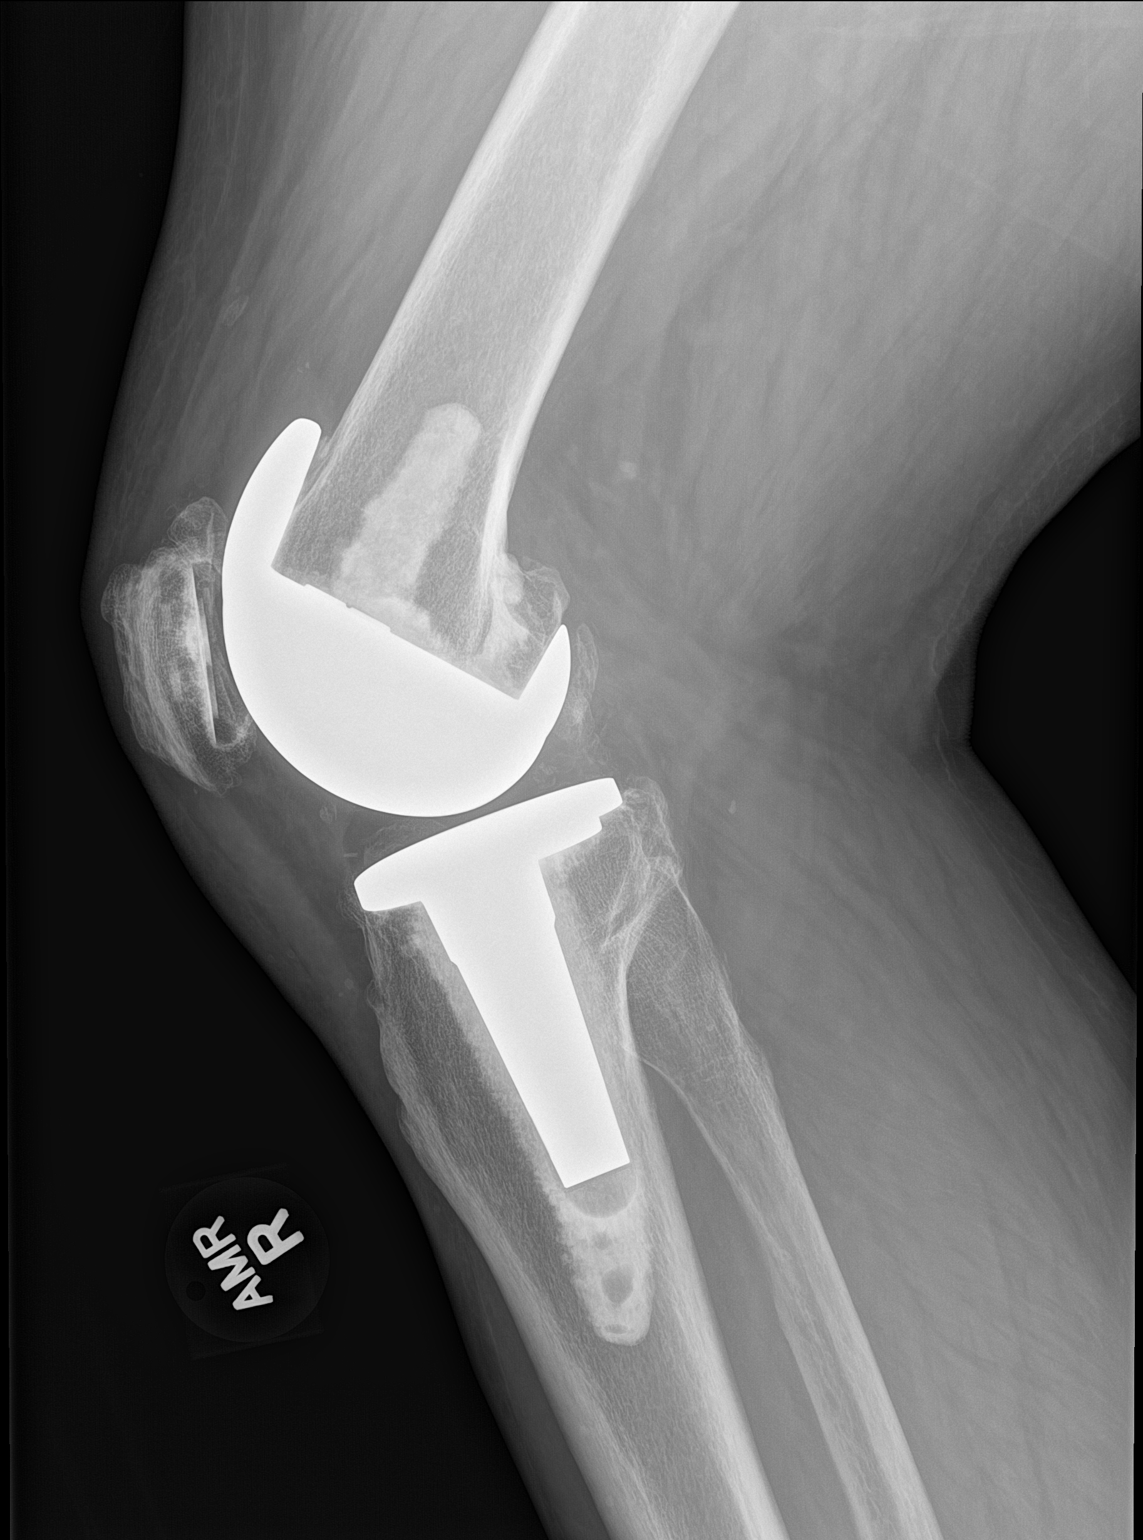

[patella ap]
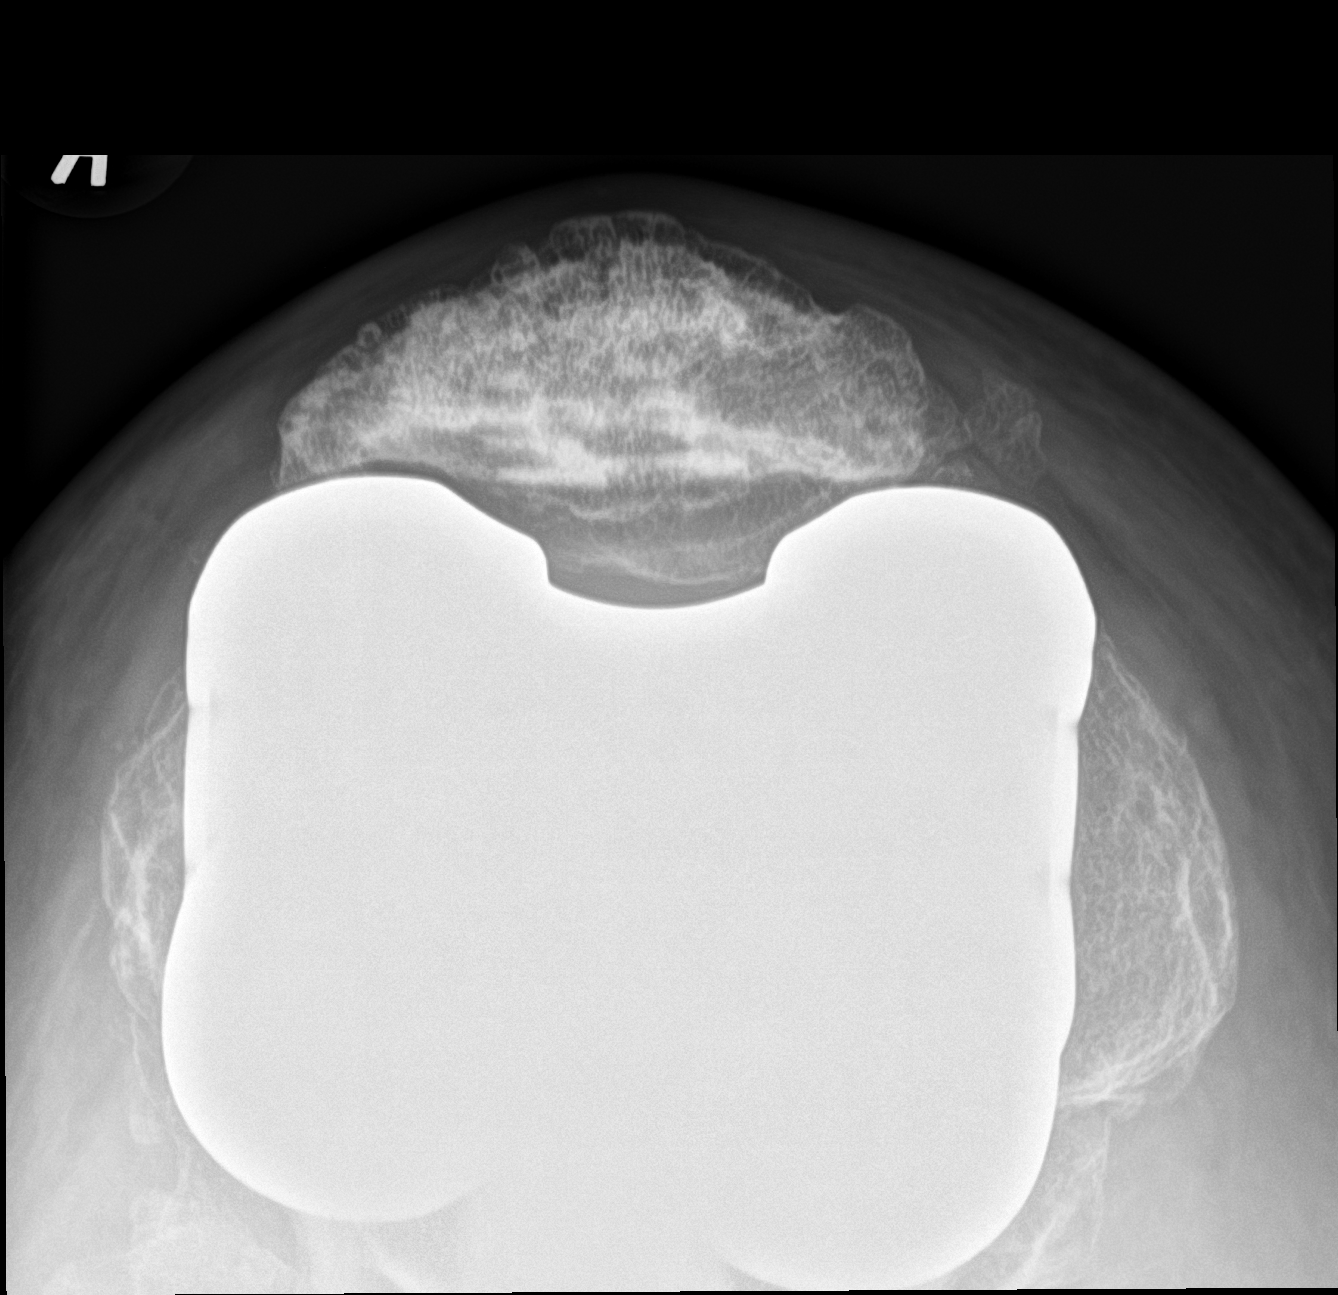

[3 of 3 positions shown; findings below may reference images not displayed]

FINDINGS: Cemented right total knee arthroplasty has been performed. Normal
overall alignment. No acute fracture or dislocation. No effusion.
Minimal prepatellar soft tissue swelling.
IMPRESSION: Minimal prepatellar soft tissue swelling. No acute fracture or
dislocation.
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
